# Patient Record
Sex: Female | Born: 1955 | Race: White | Hispanic: No | Smoking: Current every day smoker
Health system: Southern US, Community
[De-identification: ages and names within clinical notes are randomized; demographics above are authoritative.]

## PROBLEM LIST (undated history)

## (undated) DIAGNOSIS — E119 Type 2 diabetes mellitus without complications: Secondary | ICD-10-CM

## (undated) DIAGNOSIS — Z9289 Personal history of other medical treatment: Secondary | ICD-10-CM

## (undated) DIAGNOSIS — E785 Hyperlipidemia, unspecified: Secondary | ICD-10-CM

## (undated) DIAGNOSIS — M48061 Spinal stenosis, lumbar region without neurogenic claudication: Secondary | ICD-10-CM

## (undated) DIAGNOSIS — F419 Anxiety disorder, unspecified: Secondary | ICD-10-CM

## (undated) DIAGNOSIS — D649 Anemia, unspecified: Secondary | ICD-10-CM

## (undated) DIAGNOSIS — I10 Essential (primary) hypertension: Secondary | ICD-10-CM

## (undated) DIAGNOSIS — I493 Ventricular premature depolarization: Secondary | ICD-10-CM

## (undated) HISTORY — DX: Essential (primary) hypertension: I10

## (undated) HISTORY — DX: Type 2 diabetes mellitus without complications: E11.9

## (undated) HISTORY — DX: Hyperlipidemia, unspecified: E78.5

## (undated) HISTORY — DX: Anxiety disorder, unspecified: F41.9

## (undated) HISTORY — DX: Spinal stenosis, lumbar region without neurogenic claudication: M48.061

## (undated) HISTORY — DX: Personal history of other medical treatment: Z92.89

## (undated) HISTORY — DX: Ventricular premature depolarization: I49.3

## (undated) HISTORY — DX: Anemia, unspecified: D64.9

---

## 1983-04-05 DIAGNOSIS — I10 Essential (primary) hypertension: Secondary | ICD-10-CM

## 1983-04-05 HISTORY — DX: Essential (primary) hypertension: I10

## 1991-04-05 HISTORY — PX: ANKLE FRACTURE SURGERY: SHX122

## 2006-04-04 DIAGNOSIS — E119 Type 2 diabetes mellitus without complications: Secondary | ICD-10-CM

## 2006-04-04 DIAGNOSIS — E785 Hyperlipidemia, unspecified: Secondary | ICD-10-CM

## 2006-04-04 HISTORY — DX: Hyperlipidemia, unspecified: E78.5

## 2006-04-04 HISTORY — DX: Type 2 diabetes mellitus without complications: E11.9

## 2009-04-04 HISTORY — PX: CLAVICLE SURGERY: SHX598

## 2012-10-02 DIAGNOSIS — Z9289 Personal history of other medical treatment: Secondary | ICD-10-CM

## 2012-10-02 DIAGNOSIS — I493 Ventricular premature depolarization: Secondary | ICD-10-CM

## 2012-10-02 HISTORY — DX: Personal history of other medical treatment: Z92.89

## 2012-10-02 HISTORY — DX: Ventricular premature depolarization: I49.3

## 2016-04-04 HISTORY — PX: HIP FRACTURE SURGERY: SHX118

## 2017-01-26 ENCOUNTER — Other Ambulatory Visit: Payer: Self-pay | Admitting: Orthopedic Surgery

## 2017-01-26 DIAGNOSIS — E2839 Other primary ovarian failure: Secondary | ICD-10-CM

## 2017-02-01 ENCOUNTER — Ambulatory Visit
Admission: RE | Admit: 2017-02-01 | Discharge: 2017-02-01 | Disposition: A | Discharge status: Home / Self Care | Payer: BLUE CROSS/BLUE SHIELD | Source: Ambulatory Visit | Attending: Orthopedic Surgery | Admitting: Orthopedic Surgery

## 2017-02-01 DIAGNOSIS — E2839 Other primary ovarian failure: Secondary | ICD-10-CM

## 2017-02-09 ENCOUNTER — Other Ambulatory Visit: Payer: Self-pay

## 2017-04-12 ENCOUNTER — Telehealth: Payer: Self-pay | Admitting: Medical

## 2017-04-12 NOTE — Telephone Encounter (Signed)
Records received from [facility] put in your folder for review

## 2017-04-26 ENCOUNTER — Ambulatory Visit: Payer: BLUE CROSS/BLUE SHIELD | Admitting: Medical

## 2017-04-26 ENCOUNTER — Encounter: Payer: Self-pay | Admitting: Medical

## 2017-04-26 VITALS — BP 150/90 | HR 88 | Ht 62.5 in | Wt 152.8 lb

## 2017-04-26 DIAGNOSIS — F418 Other specified anxiety disorders: Secondary | ICD-10-CM | POA: Diagnosis not present

## 2017-04-26 DIAGNOSIS — G4700 Insomnia, unspecified: Secondary | ICD-10-CM

## 2017-04-26 DIAGNOSIS — M545 Low back pain, unspecified: Secondary | ICD-10-CM

## 2017-04-26 DIAGNOSIS — G8929 Other chronic pain: Secondary | ICD-10-CM

## 2017-04-26 DIAGNOSIS — E118 Type 2 diabetes mellitus with unspecified complications: Secondary | ICD-10-CM

## 2017-04-26 DIAGNOSIS — I10 Essential (primary) hypertension: Secondary | ICD-10-CM | POA: Insufficient documentation

## 2017-04-26 DIAGNOSIS — F17200 Nicotine dependence, unspecified, uncomplicated: Secondary | ICD-10-CM | POA: Diagnosis not present

## 2017-04-26 DIAGNOSIS — E785 Hyperlipidemia, unspecified: Secondary | ICD-10-CM | POA: Diagnosis not present

## 2017-04-26 MED ORDER — ZOLPIDEM TARTRATE 5 MG PO TABS: 5.0000 mg | ORAL_TABLET | Freq: Every day | ORAL | 2 refills | Status: DC

## 2017-04-26 MED ORDER — HYDROCODONE-ACETAMINOPHEN 5-325 MG PO TABS: ORAL_TABLET | ORAL | 0 refills | Status: DC

## 2017-04-26 MED ORDER — FLUTICASONE PROPIONATE 50 MCG/ACT NA SUSP: NASAL | 5 refills | Status: DC

## 2017-04-26 NOTE — Progress Notes (Signed)
Subjective: Chief Complaint  Patient presents with  . New Patient (Initial Visit)    new pt need refills on meds just move to the area , pt getting her records sent here from old pcp   Here as a new patient today.   From [place], moved to [place] in 02/2017 to be with her daughter and her family.  Back in [place] was mainly seeing PCP.   Here daughter recently established care here with Harland Dingwall, NP  She has a history of HTN for many years, became diabetic and high cholesterol since 2008.   Last labs for diabetes 01/2017.  HgbA1C was 5.2% on that visit.   At that point was on Glyburide and Metformin.  At that time Trulicity was added and glyburide was taken off.   No hx/o kidney disease or liver disease.  Blood work usually looks good except WBCs run a little elevated.   TRIG are always a little high.    Has hx/o chronic back pain.  Was seeing PT for a while but that didn't help.   Was referred to pain specialist, was suppose to have injection in back but the day she was suppose to see the pain clinic she fell and broke her hip.   Takes 2 tablets of the 4mg  Tizanidine once daily at night.  Takes Hydrocodone 1 tablet BID for pain.   Been on this since 01/2017.  Was on tramadol prior.  In the past has seen pain clinic.  Gets relief with injection in back for periods of time.  She had broken her hip in October, had hip surgery, was seeing Dr. Marcelino Scot, ortho (right hip).   She reports hx/o depression and anxiety.  Takes Lexapro and Abilify, been on this for years.  Insomnia - takes Ambien 5mg  nightly.   Currently she is out of her Ambien, pain medication and Flonase.  Exercise is limited, does some walking.   Eats healthy, checks glucose some, not daily. Usually seeing good numbers.  Had stress test of heart 2014 in [place].   Has moved from [place] to [place], then to [place], then here to [place] with her family (daughter and her family).    Past Medical History:  Diagnosis Date  .  Diabetes mellitus without complication ([place]) 8242  . Hyperlipidemia 2008  . Hypertension 1985   Current Outpatient Medications on File Prior to Visit  Medication Sig Dispense Refill  . ARIPiprazole (ABILIFY) 5 MG tablet Take 5 mg by mouth daily.  3  . aspirin EC 81 MG tablet Take 81 mg by mouth daily.    Marland Kitchen atenolol (TENORMIN) 100 MG tablet Take by mouth.    Marland Kitchen atorvastatin (LIPITOR) 40 MG tablet Take 40 mg by mouth daily.  0  . Cholecalciferol (VITAMIN D PO) Take by mouth.    . escitalopram (LEXAPRO) 20 MG tablet Take 20 mg by mouth daily.  2  . losartan-hydrochlorothiazide (HYZAAR) 100-25 MG tablet Take 1 tablet by mouth daily.  2  . metFORMIN (GLUCOPHAGE) 1000 MG tablet Take 1,000 mg by mouth 2 (two) times daily.  0  . tiZANidine (ZANAFLEX) 4 MG tablet TAKE ONE TABLET BY MOUTH TWICE DAILY AS NEEDED FOR SPASMS  0  . TRULICITY 3.53 IR/4.4RX SOPN 0.75 MG ONCE A WEEK SUBCUTANEOUSLY 90 DAYS  1   No current facility-administered medications on file prior to visit.    ROS as in subjective    Objective: BP (!) 150/90   Pulse 88   Ht 5' 2.5" (  1.588 m)   Wt 152 lb 12.8 oz (69.3 kg)   SpO2 97%   BMI 27.50 kg/m   General appearance: alert, no distress, WD/WN,white female Oral cavity: MMM, no lesions Neck: supple, no lymphadenopathy, no thyromegaly, no masses, no bruits Heart: RRR, normal S1, S2, 2/6 holosystolic murmur heard in bilat upper sternal borders Lungs: CTA bilaterally, no wheezes, rhonchi, or rales Back: nontender, mild pain with ROM which is mildly lmilted Ext: no edema Pulses: 1+ symmetric, upper and lower extremities, normal cap refill     Assessment: Encounter Diagnoses  Name Primary?  . Controlled type 2 diabetes mellitus with complication, without long-term current use of insulin ([place]) Yes  . Essential hypertension, benign   . Hyperlipidemia, unspecified hyperlipidemia type   . Smoker   . Insomnia, unspecified type   . Chronic bilateral low back pain without  sciatica   . Depression with anxiety     Plan: We reviewed her health history, reviewed her medication list.  I had received records on her a few weeks ago but they are in the scan transition right now so they are unable to be reviewed today  From her report today it sounds like she has been pretty consistent with her care with primary care, has been fairly well controlled on her current regimen which seems long-term.  No red flag issues today.  We will try to obtain a copy of her 2014 heart stress test.  There is a heart murmur today and she is aware of this.  We discussed chronic pain, treatment options, we discussed her prior evaluation and treatments.  I advised that we do not typically use chronic narcotics at this office.  I will have a chance to review her records before making a final determination on this.  I gave her a month supply currently.  Likewise I refilled her Ambien for a month  I will plan to see her back in 1 month   Lashanta was seen today for new patient (initial visit).  Diagnoses and all orders for this visit:  Controlled type 2 diabetes mellitus with complication, without long-term current use of insulin (HCC)  Essential hypertension, benign  Hyperlipidemia, unspecified hyperlipidemia type  Smoker  Insomnia, unspecified type  Chronic bilateral low back pain without sciatica  Depression with anxiety  Other orders -     fluticasone (FLONASE) 50 MCG/ACT nasal spray; SPRAY 1 SPRAY INTO EACH NOSTRIL EVERY DAY -     zolpidem (AMBIEN) 5 MG tablet; Take 1 tablet (5 mg total) by mouth at bedtime. -     HYDROcodone-acetaminophen (NORCO/VICODIN) 5-325 MG tablet; TAKE 1 TO 2 TABLETS EVERY 12 HOURS AS NEEDED FOR SEVERE PAIN

## 2017-04-27 ENCOUNTER — Encounter: Payer: Self-pay | Admitting: Medical

## 2017-04-27 ENCOUNTER — Other Ambulatory Visit: Payer: Self-pay | Admitting: Orthopedic Surgery

## 2017-04-27 DIAGNOSIS — M25551 Pain in right hip: Secondary | ICD-10-CM

## 2017-04-27 DIAGNOSIS — S72144K Nondisplaced intertrochanteric fracture of right femur, subsequent encounter for closed fracture with nonunion: Secondary | ICD-10-CM

## 2017-04-30 ENCOUNTER — Telehealth: Payer: Self-pay | Admitting: Medical

## 2017-04-30 NOTE — Telephone Encounter (Signed)
P.A. FLUTICASONE PROPIONATE

## 2017-05-01 ENCOUNTER — Ambulatory Visit
Admission: RE | Admit: 2017-05-01 | Discharge: 2017-05-01 | Disposition: A | Discharge status: Home / Self Care | Payer: BLUE CROSS/BLUE SHIELD | Source: Ambulatory Visit | Attending: Orthopedic Surgery | Admitting: Orthopedic Surgery

## 2017-05-01 DIAGNOSIS — M25551 Pain in right hip: Secondary | ICD-10-CM

## 2017-05-01 DIAGNOSIS — S72144K Nondisplaced intertrochanteric fracture of right femur, subsequent encounter for closed fracture with nonunion: Secondary | ICD-10-CM

## 2017-05-02 ENCOUNTER — Telehealth: Payer: Self-pay | Admitting: Medical

## 2017-05-02 NOTE — Telephone Encounter (Signed)
Rcvd office notes from Dr Trish Mage

## 2017-05-21 NOTE — Telephone Encounter (Signed)
Never got response from P.A., so I had to resubmit another P.A. to a different [employer] plan and was able to get it to go thru and was able to get it approved

## 2017-05-24 NOTE — Telephone Encounter (Signed)
Pt informed

## 2017-05-29 ENCOUNTER — Telehealth: Payer: Self-pay | Admitting: Medical

## 2017-05-29 ENCOUNTER — Encounter: Payer: Self-pay | Admitting: Medical

## 2017-05-29 ENCOUNTER — Ambulatory Visit: Payer: BLUE CROSS/BLUE SHIELD | Admitting: Medical

## 2017-05-29 VITALS — BP 128/88 | HR 86 | Wt 153.8 lb

## 2017-05-29 DIAGNOSIS — E785 Hyperlipidemia, unspecified: Secondary | ICD-10-CM | POA: Diagnosis not present

## 2017-05-29 DIAGNOSIS — F17200 Nicotine dependence, unspecified, uncomplicated: Secondary | ICD-10-CM | POA: Diagnosis not present

## 2017-05-29 DIAGNOSIS — E118 Type 2 diabetes mellitus with unspecified complications: Secondary | ICD-10-CM

## 2017-05-29 DIAGNOSIS — G4700 Insomnia, unspecified: Secondary | ICD-10-CM | POA: Diagnosis not present

## 2017-05-29 DIAGNOSIS — G8929 Other chronic pain: Secondary | ICD-10-CM

## 2017-05-29 DIAGNOSIS — F418 Other specified anxiety disorders: Secondary | ICD-10-CM | POA: Diagnosis not present

## 2017-05-29 DIAGNOSIS — I10 Essential (primary) hypertension: Secondary | ICD-10-CM | POA: Diagnosis not present

## 2017-05-29 DIAGNOSIS — M545 Low back pain, unspecified: Secondary | ICD-10-CM

## 2017-05-29 MED ORDER — TRULICITY 0.75 MG/0.5ML ~~LOC~~ SOAJ: SUBCUTANEOUS | 1 refills | Status: DC

## 2017-05-29 MED ORDER — HYDROCODONE-ACETAMINOPHEN 5-325 MG PO TABS: ORAL_TABLET | ORAL | 0 refills | Status: DC

## 2017-05-29 NOTE — Progress Notes (Addendum)
Subjective: Chief Complaint  Patient presents with  . Follow-up    follow up and discuss truilicity   Here for 1 month recheck.  I saw her as a new patient last month.  She notes that she found out last week that she is being laid off from work.  Works for [employer].    Diabetes- not checking glucose, but not checking.  Checking feet for sores and wounds, no concerns.  Hasn't had Trulicity filled with her current insurance as she has had plenty of supply but has 1 week left of Trulicity and insurance runs out on Thursday.  Fasting today.  Exercise - walking, but with all the recent rain has limited this.    Diet - relatively healthy  She has a history of HTN for many years, became diabetic and high cholesterol since 2008.   Last labs for diabetes 01/2017.  HgbA1C was 5.2% on that visit.   At that point was on Glyburide and Metformin.  At that time Trulicity was added and glyburide was taken off.   No hx/o kidney disease or liver disease.  Blood work usually looks good except WBCs run a little elevated.   TRIG are always a little high.    Has hx/o chronic back pain.  Was seeing PT for a while but that didn't help.   Was referred to pain specialist, was suppose to have injection in back but the day she was suppose to see the pain clinic she fell and broke her hip.   Takes 2 tablets of the 4mg  Tizanidine once daily at night.  Takes Hydrocodone 1 tablet BID for pain.   Been on this since 01/2017.  Was on tramadol prior.  In the past has seen pain clinic.  Gets relief with injection in back for periods of time.  She had broken her hip in October, had hip surgery, was seeing Dr. Marcelino Scot, ortho (right hip).   She reports hx/o depression and anxiety.  Takes Lexapro and Abilify, been on this for years.  Insomnia - takes Ambien 5mg  nightly.    Past Medical History:  Diagnosis Date  . Diabetes mellitus without complication ([place]) 9163  . Hyperlipidemia 2008  . Hypertension 1985  . 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  Dr. Trish Mage   Current Outpatient Medications on File Prior to Visit  Medication Sig Dispense Refill  . ARIPiprazole (ABILIFY) 5 MG tablet Take 5 mg by mouth daily.  3  . aspirin EC 81 MG tablet Take 81 mg by mouth daily.    Marland Kitchen atenolol (TENORMIN) 100 MG tablet Take by mouth.    Marland Kitchen atorvastatin (LIPITOR) 40 MG tablet Take 40 mg by mouth daily.  0  . Cholecalciferol (VITAMIN D PO) Take by mouth.    . escitalopram (LEXAPRO) 20 MG tablet Take 20 mg by mouth daily.  2  . fluticasone (FLONASE) 50 MCG/ACT nasal spray SPRAY 1 SPRAY INTO EACH NOSTRIL EVERY DAY 16 g 5  . losartan-hydrochlorothiazide (HYZAAR) 100-25 MG tablet Take 1 tablet by mouth daily.  2  . metFORMIN (GLUCOPHAGE) 1000 MG tablet Take 1,000 mg by mouth 2 (two) times daily.  0  . tiZANidine (ZANAFLEX) 4 MG tablet TAKE ONE TABLET BY MOUTH TWICE DAILY AS NEEDED FOR SPASMS  0  . zolpidem (AMBIEN) 5 MG tablet Take 1 tablet (5 mg total) by mouth at bedtime. 30 tablet 2   No current facility-administered medications on file prior to  visit.    ROS as in subjective   Objective: BP 128/88   Pulse 86   Wt 153 lb 12.8 oz (69.8 kg)   SpO2 98%   BMI 27.68 kg/m   Wt Readings from Last 3 Encounters:  05/29/17 153 lb 12.8 oz (69.8 kg)  04/26/17 152 lb 12.8 oz (69.3 kg)   General appearance: alert, no distress, WD/WN, white female Oral cavity: MMM, no lesions Neck: supple, no lymphadenopathy, no thyromegaly, no masses Heart: RRR, normal S1, S2, no murmurs Lungs: CTA bilaterally, no wheezes, rhonchi, or rales Abdomen: +bs, soft, non tender, non distended, no masses, no hepatomegaly, no splenomegaly Back: mild lumbar tenderness in general, ROM somewhat slow, 85-90% of normal ROM.  No deformity. Pulses: 1+ symmetric, upper and lower extremities, normal cap refill Ext: no edema Right hip ROM mildly reduced compared to left which is full,  legs nontender, no deformity other than right hip S/P surgery.  No swelling Neuro: CN2-12 intact, nonfocal exam, normal strength, sensation and DTRs Psych: pleasant, good eye contact, answers questions appropriately  Diabetic Foot Exam - Simple   Simple Foot Form Diabetic Foot exam was performed with the following findings:  Yes 05/29/2017  8:40 AM  Visual Inspection No deformities, no ulcerations, no other skin breakdown bilaterally:  Yes Sensation Testing Intact to touch and monofilament testing bilaterally:  Yes Pulse Check Posterior Tibialis and Dorsalis pulse intact bilaterally:  Yes Comments      Assessment: Encounter Diagnoses  Name Primary?  . Controlled type 2 diabetes mellitus with complication, without long-term current use of insulin ([place]) Yes  . Essential hypertension, benign   . Hyperlipidemia, unspecified hyperlipidemia type   . Insomnia, unspecified type   . Smoker   . Depression with anxiety   . Chronic bilateral low back pain without sciatica     Plan: Controlled type 2 diabetes mellitus with complication, without long-term current use of insulin ([place]) Diabetes- recommend she check her sugars at least some days per week, continue daily foot checks, yearly eye doctor visit.  Continue Metformin, and we will push to get the Trulicity pen approved through insurance since she is having some issues with this.  Counseled on diet and exercise, routine follow-up.  Chronic bilateral low back pain without sciatica Chronic back pain, last MRI in chart reviewed from 7/18 lumbar spine.  I reviewed prior records.  Her visit in April 11, 2017 showed a negative drug screen, and at that time tramadol 50 mg was prescribed for as the visit prior to that she was given hydrocodone.  She does take tizanidine muscle relaxer 2 mg, 1-2 tablets up to twice daily.  I counseled on the need to try to limit both pain medication and muscle relaxer including try and 1/2 tablet instead of a whole  tablet and no more than twice daily.  She is still a new patient to [place] making this the second visit.  We also saw her today for her other chronic issues so we will need to spend more time discussing pain control next visit  Essential hypertension, benign Continue current medication.  I reviewed a July 2014 cardiology note where she had evaluation for asymptomatic PVCs, controlled by beta-blocker.  It was recommended at that time that she have an exercise stress test and echocardiogram.  Insomnia Continue current medication  Smoker Advised smoking cessation  Hyperlipidemia Labs today, continue current medication   Lollie was seen today for follow-up.  Diagnoses and all orders for this visit:  Controlled  type 2 diabetes mellitus with complication, without long-term current use of insulin (HCC) -     Comprehensive metabolic panel -     CBC with Differential/Platelet -     Hemoglobin A1c -     HM DIABETES FOOT EXAM -     HM DIABETES EYE EXAM -     Microalbumin / creatinine urine ratio  Essential hypertension, benign -     Comprehensive metabolic panel -     CBC with Differential/Platelet  Hyperlipidemia, unspecified hyperlipidemia type -     Comprehensive metabolic panel -     Lipid panel  Insomnia, unspecified type  Smoker  Depression with anxiety  Chronic bilateral low back pain without sciatica -     CBC with Differential/Platelet  Other orders -     TRULICITY 0.81 KG/8.1EH SOPN; 0.75 MG ONCE A WEEK SUBCUTANEOUSLY 90 DAYS -     HYDROcodone-acetaminophen (NORCO/VICODIN) 5-325 MG tablet; TAKE 1 TO 2 TABLETS EVERY 12 HOURS AS NEEDED FOR SEVERE PAIN

## 2017-05-29 NOTE — Assessment & Plan Note (Signed)
Continue current medication.

## 2017-05-29 NOTE — Patient Instructions (Addendum)
Recommendations:  Call your insurer or review website for Drug Formulary  Trulicity is in a class of medication called GLP1.  The other options in this class are Ozempic and B-Cise /bydureon.  Another class of medication to consider is the SGL2 class which includes Lovie Macadamia and Stilegtro  If we need to use long acting once daily insulin ,the options are Basaglar, Lantus, Tyler Aas, and Toujeo  Vaccines:  I recommend you have a Td (tetanus, diptheria) vaccine.  This vaccine is usually updated every 10 years.  I recommend you have a shingles vaccine to help prevent shingles or herpes zoster outbreak.   Please call your insurer to inquire about coverage for the Shingrix vaccine given in 2 doses.   Some insurers cover this vaccine after age 48, some cover this after age 63.  If your insurer covers this, then call to schedule appointment to have this vaccine here.  I refilled your Norco pain medication today.  Please use them sparingly.  I did see back in October that your prior doctor was planning to wean down to 1 tablet daily.   I recommend you try using 1/2 - 1 tablet as needed for worse pain instead of 1 tablet twice daily every day.   Make stretching and exercise a priority.  I still need to review your records since I just now have them.

## 2017-05-29 NOTE — Assessment & Plan Note (Signed)
-  Advised smoking cessation

## 2017-05-29 NOTE — Assessment & Plan Note (Signed)
Labs today, continue current medication 

## 2017-05-29 NOTE — Assessment & Plan Note (Signed)
Chronic back pain, last MRI in chart reviewed from 7/18 lumbar spine.  I reviewed prior records.  Her visit in April 11, 2017 showed a negative drug screen, and at that time tramadol 50 mg was prescribed for as the visit prior to that she was given hydrocodone.  She does take tizanidine muscle relaxer 2 mg, 1-2 tablets up to twice daily.  I counseled on the need to try to limit both pain medication and muscle relaxer including try and 1/2 tablet instead of a whole tablet and no more than twice daily.  She is still a new patient to [place] making this the second visit.  We also saw her today for her other chronic issues so we will need to spend more time discussing pain control next visit

## 2017-05-29 NOTE — Assessment & Plan Note (Signed)
Diabetes- recommend she check her sugars at least some days per week, continue daily foot checks, yearly eye doctor visit.  Continue Metformin, and we will push to get the Trulicity pen approved through insurance since she is having some issues with this.  Counseled on diet and exercise, routine follow-up.

## 2017-05-29 NOTE — Assessment & Plan Note (Addendum)
Continue current medication.  I reviewed a July 2014 cardiology note where she had evaluation for asymptomatic PVCs, controlled by beta-blocker.  It was recommended at that time that she have an exercise stress test and echocardiogram.

## 2017-05-30 LAB — COMPREHENSIVE METABOLIC PANEL
ALBUMIN: 4.5 g/dL (ref 3.6–4.8)
ALK PHOS: 97 IU/L (ref 39–117)
ALT: 17 IU/L (ref 0–32)
AST: 17 IU/L (ref 0–40)
Albumin/Globulin Ratio: 1.6 (ref 1.2–2.2)
BUN / CREAT RATIO: 17 (ref 12–28)
BUN: 15 mg/dL (ref 8–27)
Bilirubin Total: 0.3 mg/dL (ref 0.0–1.2)
CO2: 25 mmol/L (ref 20–29)
Calcium: 10.6 mg/dL — ABNORMAL HIGH (ref 8.7–10.3)
Chloride: 97 mmol/L (ref 96–106)
Creatinine, Ser: 0.89 mg/dL (ref 0.57–1.00)
GFR calc Af Amer: 81 mL/min/{1.73_m2} (ref >59)
GFR calc non Af Amer: 71 mL/min/{1.73_m2} (ref >59)
GLOBULIN, TOTAL: 2.8 g/dL (ref 1.5–4.5)
GLUCOSE: 112 mg/dL — AB (ref 65–99)
Potassium: 4.9 mmol/L (ref 3.5–5.2)
Sodium: 136 mmol/L (ref 134–144)
Total Protein: 7.3 g/dL (ref 6.0–8.5)

## 2017-05-30 LAB — CBC WITH DIFFERENTIAL/PLATELET
Basophils Absolute: 0 10*3/uL (ref 0.0–0.2)
Basos: 0 %
EOS (ABSOLUTE): 0.3 10*3/uL (ref 0.0–0.4)
Eos: 2 %
Hematocrit: 33.2 % — ABNORMAL LOW (ref 34.0–46.6)
Hemoglobin: 10.7 g/dL — ABNORMAL LOW (ref 11.1–15.9)
Immature Grans (Abs): 0 10*3/uL (ref 0.0–0.1)
Immature Granulocytes: 0 %
LYMPHS ABS: 3.3 10*3/uL — AB (ref 0.7–3.1)
Lymphs: 26 %
MCH: 26.3 pg — ABNORMAL LOW (ref 26.6–33.0)
MCHC: 32.2 g/dL (ref 31.5–35.7)
MCV: 82 fL (ref 79–97)
MONOS ABS: 0.8 10*3/uL (ref 0.1–0.9)
Monocytes: 7 %
Neutrophils Absolute: 8.2 10*3/uL — ABNORMAL HIGH (ref 1.4–7.0)
Neutrophils: 65 %
PLATELETS: 586 10*3/uL — AB (ref 150–379)
RBC: 4.07 x10E6/uL (ref 3.77–5.28)
RDW: 15.7 % — AB (ref 12.3–15.4)
WBC: 12.7 10*3/uL — AB (ref 3.4–10.8)

## 2017-05-30 LAB — LIPID PANEL
CHOL/HDL RATIO: 2.1 ratio (ref 0.0–4.4)
CHOLESTEROL TOTAL: 140 mg/dL (ref 100–199)
HDL: 67 mg/dL (ref >39)
LDL CALC: 42 mg/dL (ref 0–99)
Triglycerides: 157 mg/dL — ABNORMAL HIGH (ref 0–149)
VLDL CHOLESTEROL CAL: 31 mg/dL (ref 5–40)

## 2017-05-30 LAB — HEMOGLOBIN A1C
ESTIMATED AVERAGE GLUCOSE: 134 mg/dL
HEMOGLOBIN A1C: 6.3 % — AB (ref 4.8–5.6)

## 2017-05-30 LAB — MICROALBUMIN / CREATININE URINE RATIO
Creatinine, Urine: 79 mg/dL
Microalb/Creat Ratio: <3.8 mg/g creat (ref 0.0–30.0)
Microalbumin, Urine: <3 ug/mL

## 2017-05-31 ENCOUNTER — Encounter: Payer: Self-pay | Admitting: Medical

## 2017-05-31 NOTE — Addendum Note (Signed)
Addended by: Carlena Hurl on: 05/31/2017 08:46 PM   Modules accepted: Level of Service

## 2017-06-03 NOTE — Telephone Encounter (Signed)
P.A. TRULICITY done and approved, left message for pt, activated discount card and called pharmacy went thru for $25

## 2017-06-08 ENCOUNTER — Encounter: Payer: Self-pay | Admitting: Medical

## 2017-06-12 ENCOUNTER — Other Ambulatory Visit: Payer: Self-pay | Admitting: Medical

## 2017-06-12 ENCOUNTER — Encounter: Payer: Self-pay | Admitting: Medical

## 2017-06-26 ENCOUNTER — Ambulatory Visit: Payer: BLUE CROSS/BLUE SHIELD | Admitting: Medical

## 2017-06-26 ENCOUNTER — Encounter: Payer: Self-pay | Admitting: Medical

## 2017-06-26 ENCOUNTER — Encounter: Payer: Self-pay | Admitting: Internal Medicine

## 2017-06-26 VITALS — BP 128/82 | HR 84 | Ht 63.0 in | Wt 152.0 lb

## 2017-06-26 DIAGNOSIS — G8929 Other chronic pain: Secondary | ICD-10-CM

## 2017-06-26 DIAGNOSIS — I10 Essential (primary) hypertension: Secondary | ICD-10-CM | POA: Diagnosis not present

## 2017-06-26 DIAGNOSIS — D649 Anemia, unspecified: Secondary | ICD-10-CM | POA: Diagnosis not present

## 2017-06-26 DIAGNOSIS — Z1211 Encounter for screening for malignant neoplasm of colon: Secondary | ICD-10-CM | POA: Diagnosis not present

## 2017-06-26 DIAGNOSIS — E118 Type 2 diabetes mellitus with unspecified complications: Secondary | ICD-10-CM

## 2017-06-26 DIAGNOSIS — G4700 Insomnia, unspecified: Secondary | ICD-10-CM | POA: Diagnosis not present

## 2017-06-26 DIAGNOSIS — E785 Hyperlipidemia, unspecified: Secondary | ICD-10-CM | POA: Diagnosis not present

## 2017-06-26 DIAGNOSIS — D72829 Elevated white blood cell count, unspecified: Secondary | ICD-10-CM | POA: Insufficient documentation

## 2017-06-26 DIAGNOSIS — M545 Low back pain: Secondary | ICD-10-CM | POA: Diagnosis not present

## 2017-06-26 DIAGNOSIS — F17200 Nicotine dependence, unspecified, uncomplicated: Secondary | ICD-10-CM | POA: Diagnosis not present

## 2017-06-26 DIAGNOSIS — F418 Other specified anxiety disorders: Secondary | ICD-10-CM

## 2017-06-26 DIAGNOSIS — Z1239 Encounter for other screening for malignant neoplasm of breast: Secondary | ICD-10-CM | POA: Insufficient documentation

## 2017-06-26 MED ORDER — TIZANIDINE HCL 4 MG PO TABS: ORAL_TABLET | ORAL | 2 refills | Status: DC

## 2017-06-26 MED ORDER — ZOLPIDEM TARTRATE 5 MG PO TABS: 5.0000 mg | ORAL_TABLET | Freq: Every day | ORAL | 2 refills | Status: DC

## 2017-06-26 MED ORDER — TRULICITY 0.75 MG/0.5ML ~~LOC~~ SOAJ: SUBCUTANEOUS | 2 refills | Status: DC

## 2017-06-26 MED ORDER — ARIPIPRAZOLE 5 MG PO TABS: 5.0000 mg | ORAL_TABLET | Freq: Every day | ORAL | 1 refills | Status: DC

## 2017-06-26 MED ORDER — ATORVASTATIN CALCIUM 40 MG PO TABS: 40.0000 mg | ORAL_TABLET | Freq: Every day | ORAL | 3 refills | Status: AC

## 2017-06-26 MED ORDER — ESCITALOPRAM OXALATE 20 MG PO TABS: 20.0000 mg | ORAL_TABLET | Freq: Every day | ORAL | 1 refills | Status: DC

## 2017-06-26 MED ORDER — METFORMIN HCL 1000 MG PO TABS: 1000.0000 mg | ORAL_TABLET | Freq: Two times a day (BID) | ORAL | 3 refills | Status: DC

## 2017-06-26 MED ORDER — LOSARTAN POTASSIUM-HCTZ 100-25 MG PO TABS: 1.0000 | ORAL_TABLET | Freq: Every day | ORAL | 3 refills | Status: DC

## 2017-06-26 MED ORDER — HYDROCODONE-ACETAMINOPHEN 5-325 MG PO TABS: ORAL_TABLET | ORAL | 0 refills | Status: DC

## 2017-06-26 NOTE — Progress Notes (Signed)
Subjective: Chief Complaint  Patient presents with  . Follow-up    blood work follow, and pain follow up    Here for one-month follow-up.   I saw her a month ago for diabetes and chronic disease follow-up, labs that visit.  Her labs showed anemia.  Denies bruising, no blood in stool or urine.  No other bleeding.  Last colonoscopy was 10 years in [place], [place].  Not sure of last Hgb.  Denies anemia on last labs in past year.  Her labs last visit also showed elevated white count, mildly elevated calcium.  Last visit her hemoglobin A1c was 6.3%, we continue Metformin and Trulicity.  She has a history of HTN for many years, became diabetic and high cholesterol since 2008.   Last labs for diabetes 01/2017.  HgbA1C was 5.2% on that visit.   At that point was on Glyburide and Metformin.  At that time Trulicity was added and glyburide was taken off.   No hx/o kidney disease or liver disease.  Blood work usually looks good except WBCs run a little elevated.   TRIG are always a little high.    Has hx/o chronic back pain.  Was seeing PT for a while but that didn't help.   Was referred to pain specialist, was suppose to have injection in back but the day she was suppose to see the pain clinic she fell and broke her hip.  Uses exercises from PT for home exercise.   Has chronic back and leg pain.  Walks the neighborhood regularly.   Takes 2 tablets of the 4mg  Tizanidine once daily at night.  Takes Hydrocodone 1 tablet BID for pain.   Been on this since 01/2017.  Was on tramadol prior.  In the past has seen pain clinic.  Gets relief with injection in back for periods of time.  She had broken her hip in October, had hip surgery, was seeing Dr. Marcelino Scot, ortho (right hip).  Is interested in another injection in her back for pain.   Last MRI 11/2016.   She reports hx/o depression and anxiety.  Takes Lexapro and Abilify, been on this for years.  Insomnia - takes Ambien 5mg  nightly.   Of note, she had stress test and  echo in 2014, reviewed records.    Past Medical History:  Diagnosis Date  . Diabetes mellitus without complication ([place]) 2595  . History of stress test 10/2012   stress test and echo  . Hyperlipidemia 2008  . Hypertension 1985  . PVC (premature ventricular contraction) 10/2012   cardiology eval for Asymptomatic PVCs controlled on beta-blocker.  Dr. Trish Mage   Current Outpatient Medications on File Prior to Visit  Medication Sig Dispense Refill  . aspirin EC 81 MG tablet Take 81 mg by mouth daily.    Marland Kitchen atenolol (TENORMIN) 100 MG tablet TAKE 1 TABLET BY MOUTH EVERY DAY 90 tablet 0  . Cholecalciferol (VITAMIN D PO) Take by mouth.    . fluticasone (FLONASE) 50 MCG/ACT nasal spray SPRAY 1 SPRAY INTO EACH NOSTRIL EVERY DAY 16 g 5  . MAGNESIUM PO Take by mouth.     No current facility-administered medications on file prior to visit.     ROS as in subjective   Objective: BP 128/82 (BP Location: Right Arm, Patient Position: Sitting, Cuff Size: Normal)   Pulse 84   Ht 5\' 3"  (1.6 m)   Wt 152 lb (68.9 kg)   SpO2 98%   BMI 26.93 kg/m   Wt  Readings from Last 3 Encounters:  06/26/17 152 lb (68.9 kg)  05/29/17 153 lb 12.8 oz (69.8 kg)  04/26/17 152 lb 12.8 oz (69.3 kg)   General appearance: alert, no distress, WD/WN, white female Psych: pleasant, good eye contact, answers questions appropriately    Assessment: Encounter Diagnoses  Name Primary?  . Chronic bilateral low back pain without sciatica Yes  . Diabetes mellitus with complication ([place])   . Essential hypertension, benign   . Depression with anxiety   . Hyperlipidemia, unspecified hyperlipidemia type   . Smoker   . Insomnia, unspecified type   . Anemia, unspecified type   . Leukocytosis, unspecified type   . Hypercalcemia   . Screen for colon cancer     Plan: Anemia - plan to update coloscopy, referral made.  Iron/ferrin labs today.  leukocytosis - has seen hematology twice prior for this.   Last consult  for this was in [place] in 2015.  Saw hematology for this prior in [place], [place].   We will monitor for now.  Hypercalcemia - she notes taking 1200mg  of calcium and Vit D once and another supplement at night.  Advised to stop the night time dose and use the morning dose but split up to BID.  Plan to recheck this in 36mo.    Controlled type 2 diabetes mellitus with complication, without long-term current use of insulin (HCC) C/t truliticy and metformin.  Chronic bilateral low back pain without sciatica Chronic back pain, last MRI in chart reviewed from 7/18 lumbar spine.  I reviewed prior records.  Her visit in April 11, 2017 showed a negative drug screen.  We will continue medications currently, but she is a new patient to [place].   Referral to spine specialist for possible EDSI or other treatment recommendations.    Essential hypertension, benign Continue current medication.  I reviewed 2014 stress test and echo.  Insomnia Continue current medication  Smoker Advised smoking cessation  Hyperlipidemia Labs today, continue current medication  Tomorrow was seen today for follow-up.  Diagnoses and all orders for this visit:  Chronic bilateral low back pain without sciatica -     Ambulatory referral to Spine Surgery  Diabetes mellitus with complication ([place]) -     Hemoglobin A1c; Future  Essential hypertension, benign  Depression with anxiety  Hyperlipidemia, unspecified hyperlipidemia type  Smoker  Insomnia, unspecified type  Anemia, unspecified type -     Ambulatory referral to Gastroenterology -     Iron and TIBC; Future -     Ferritin; Future -     Comprehensive metabolic panel; Future -     Iron and TIBC -     Ferritin  Leukocytosis, unspecified type  Hypercalcemia -     Comprehensive metabolic panel; Future  Screen for colon cancer -     Ambulatory referral to Gastroenterology  Other orders -     metFORMIN (GLUCOPHAGE) 1000 MG tablet; Take 1  tablet (1,000 mg total) by mouth 2 (two) times daily with a meal. -     TRULICITY 4.40 HK/7.4QV SOPN; 0.75 MG ONCE A WEEK SUBCUTANEOUSLY 90 DAYS -     atorvastatin (LIPITOR) 40 MG tablet; Take 1 tablet (40 mg total) by mouth daily. -     ARIPiprazole (ABILIFY) 5 MG tablet; Take 1 tablet (5 mg total) by mouth daily. -     escitalopram (LEXAPRO) 20 MG tablet; Take 1 tablet (20 mg total) by mouth daily. -     losartan-hydrochlorothiazide (HYZAAR) 100-25 MG  tablet; Take 1 tablet by mouth daily. -     tiZANidine (ZANAFLEX) 4 MG tablet; TAKE 1/2 TABLET BY MOUTH TWICE DAILY AS NEEDED FOR SPASMS -     HYDROcodone-acetaminophen (NORCO/VICODIN) 5-325 MG tablet; TAKE 1 TO 2 TABLETS EVERY 12 HOURS AS NEEDED FOR SEVERE PAIN -     zolpidem (AMBIEN) 5 MG tablet; Take 1 tablet (5 mg total) by mouth at bedtime.

## 2017-06-27 ENCOUNTER — Other Ambulatory Visit: Payer: Self-pay | Admitting: Medical

## 2017-06-27 ENCOUNTER — Telehealth: Payer: Self-pay

## 2017-06-27 LAB — IRON AND TIBC
IRON SATURATION: 5 % — AB (ref 15–55)
Iron: 22 ug/dL — ABNORMAL LOW (ref 27–159)
TIBC: 484 ug/dL — AB (ref 250–450)
UIBC: 462 ug/dL — ABNORMAL HIGH (ref 131–425)

## 2017-06-27 LAB — FERRITIN: FERRITIN: 6 ng/mL — AB (ref 15–150)

## 2017-06-27 MED ORDER — FERROUS GLUCONATE 324 (38 FE) MG PO TABS: 324.0000 mg | ORAL_TABLET | Freq: Every day | ORAL | 3 refills | Status: DC

## 2017-06-27 NOTE — Telephone Encounter (Signed)
-----   Message from Carlena Hurl, PA-C sent at 06/27/2017  7:34 AM EDT ----- Her iron was quite low.  Please have her begin ferrous gluconate/iron twice daily with food immediately, and get her in with the gastro doctor ASAP.  You may have already handled the referral yesterday  Otherwise, plan 46-month follow-up here

## 2017-06-27 NOTE — Telephone Encounter (Signed)
Called patient and advised of note.   Patient scheduled with GI, but the soonest was in May.   Also, patient states the RX states take 1 tablet at breakfast, but the note states to take the RX twice daily.  Please advise. Thank you!

## 2017-06-28 ENCOUNTER — Telehealth: Payer: Self-pay

## 2017-06-28 NOTE — Telephone Encounter (Signed)
Called patient and advised of note. Patient understand and had no questions or concerns.

## 2017-06-28 NOTE — Telephone Encounter (Signed)
-----   Message from Carlena Hurl, PA-C sent at 06/27/2017  8:21 PM EDT ----- Sorry it should be BID not once daily.   May will be fine.

## 2017-07-09 ENCOUNTER — Telehealth: Payer: Self-pay | Admitting: Medical

## 2017-07-09 NOTE — Telephone Encounter (Signed)
P.A. HYDROCODONE ACETAMINOPHEN 

## 2017-07-18 NOTE — Telephone Encounter (Signed)
P.A. Approved, pt informed  °

## 2017-07-19 ENCOUNTER — Encounter: Payer: Self-pay | Admitting: Medical

## 2017-07-24 ENCOUNTER — Other Ambulatory Visit: Payer: Self-pay | Admitting: Medical

## 2017-07-24 MED ORDER — HYDROCODONE-ACETAMINOPHEN 5-325 MG PO TABS: ORAL_TABLET | ORAL | 0 refills | Status: DC

## 2017-07-24 NOTE — Telephone Encounter (Signed)
Is this okay to refill? 

## 2017-07-28 ENCOUNTER — Other Ambulatory Visit: Payer: Self-pay

## 2017-07-28 MED ORDER — FERROUS GLUCONATE 324 (38 FE) MG PO TABS: 324.0000 mg | ORAL_TABLET | Freq: Every day | ORAL | 3 refills | Status: DC

## 2017-07-28 NOTE — Telephone Encounter (Signed)
Pt called stating she needs a refill on the pending medication.  Please advise patient.

## 2017-08-07 ENCOUNTER — Other Ambulatory Visit (INDEPENDENT_AMBULATORY_CARE_PROVIDER_SITE_OTHER): Payer: BLUE CROSS/BLUE SHIELD

## 2017-08-07 ENCOUNTER — Ambulatory Visit: Payer: BLUE CROSS/BLUE SHIELD | Admitting: Internal Medicine

## 2017-08-07 ENCOUNTER — Encounter: Payer: Self-pay | Admitting: Internal Medicine

## 2017-08-07 VITALS — BP 110/64 | HR 80 | Ht 62.0 in | Wt 153.4 lb

## 2017-08-07 DIAGNOSIS — D509 Iron deficiency anemia, unspecified: Secondary | ICD-10-CM | POA: Diagnosis not present

## 2017-08-07 LAB — CBC WITH DIFFERENTIAL/PLATELET
BASOS PCT: 0.7 % (ref 0.0–3.0)
Basophils Absolute: 0.1 10*3/uL (ref 0.0–0.1)
EOS PCT: 2.6 % (ref 0.0–5.0)
Eosinophils Absolute: 0.2 10*3/uL (ref 0.0–0.7)
HCT: 36.3 % (ref 36.0–46.0)
Hemoglobin: 12 g/dL (ref 12.0–15.0)
LYMPHS ABS: 2.8 10*3/uL (ref 0.7–4.0)
LYMPHS PCT: 31.3 % (ref 12.0–46.0)
MCHC: 33.2 g/dL (ref 30.0–36.0)
MCV: 88.3 fl (ref 78.0–100.0)
MONO ABS: 0.6 10*3/uL (ref 0.1–1.0)
MONOS PCT: 7.4 % (ref 3.0–12.0)
NEUTROS ABS: 5.1 10*3/uL (ref 1.4–7.7)
NEUTROS PCT: 58 % (ref 43.0–77.0)
PLATELETS: 389 10*3/uL (ref 150.0–400.0)
RBC: 4.11 Mil/uL (ref 3.87–5.11)
RDW: 20 % — AB (ref 11.5–15.5)
WBC: 8.8 10*3/uL (ref 4.0–10.5)

## 2017-08-07 LAB — IGA: IgA: 247 mg/dL (ref 68–378)

## 2017-08-07 LAB — FERRITIN: FERRITIN: 12.5 ng/mL (ref 10.0–291.0)

## 2017-08-07 MED ORDER — NA SULFATE-K SULFATE-MG SULF 17.5-3.13-1.6 GM/177ML PO SOLN: 1.0000 | Freq: Once | ORAL | 0 refills | Status: AC

## 2017-08-07 NOTE — Progress Notes (Signed)
HISTORY OF PRESENT ILLNESS:  Kelsey White is a pleasant 62 y.o. female with hypertension, hyperlipidemia, diabetes mellitus, and chronic tobacco use who is new to GI and sent by her primary care provider Kelsey White with a chief complaint of iron deficiency anemia. The patient is accompanied by her daughter Kelsey White. Patient denies a prior history of anemia. She has been feeling fatigued. GI review of systems is entirely negative. She denies melena, hematochezia, abdominal pain, or weight loss. Recent blood work revealed hemoglobin of 10.7 with MCV 82. Ferritin level was 6 with iron saturation 5%. The patient has not had a menstrual period in 11 years. She has not a blood donor. No family history of celiac disease. She has been diabetic for about 15 years. She did undergo colonoscopy 10 years ago in [place]. She reports this as unremarkable. The patient has been on iron sulfate 325 mg twice daily for one month.   REVIEW OF SYSTEMS:  All non-GI ROS negative unless otherwise stated in the history of present illness except for anxiety  Past Medical History:  Diagnosis Date  . Anemia   . Anxiety   . Diabetes mellitus without complication (Kelsey White) 4270  . History of stress test 10/2012   stress test and echo  . Hyperlipidemia 2008  . Hypertension 1985  . PVC (premature ventricular contraction) 10/2012   cardiology eval for Asymptomatic PVCs controlled on beta-blocker.  Dr. Trish White    Past Surgical History:  Procedure Laterality Date  . ANKLE FRACTURE SURGERY Left 1993  . CLAVICLE SURGERY Left 2011  . HIP FRACTURE SURGERY Right 2018    Social History Kelsey White  reports that she has been smoking cigarettes.  She has been smoking about 1.00 pack per day. She has never used smokeless tobacco. She reports that she drinks about 0.6 oz of alcohol per week. She reports that she does not use drugs.  family history includes Diabetes in her father and sister; Hypertension in her  father, mother, and sister; Hypothyroidism in her daughter, sister, and sister; Lung cancer in her father.  No Known Allergies     PHYSICAL EXAMINATION: Vital signs: BP 110/64 (BP Location: Left Arm, Patient Position: Sitting, Cuff Size: Normal)   Pulse 80   Ht 5\' 2"  (1.575 m) Comment: height measured without shoes  Wt 153 lb 6 oz (69.6 kg)   BMI 28.05 kg/m   Constitutional: generally well-appearing, no acute distress Psychiatric: alert and oriented x3, cooperative Eyes: extraocular movements intact, anicteric, conjunctiva pink Mouth: oral pharynx moist, no lesions Neck: supple no lymphadenopathy Cardiovascular: heart regular rate and rhythm, no murmur Lungs: clear to auscultation bilaterally Abdomen: soft, nontender, nondistended, no obvious ascites, no peritoneal signs, normal bowel sounds, no organomegaly Rectal:deferred until colonoscopy Extremities: no clubbing, cyanosis, or lower extremity edema bilaterally Skin: no lesions on visible extremities Neuro: No focal deficits. Normal DTRs. Cranial nerves intact  ASSESSMENT:  #1. Asymptomatic and deficiency anemia. Rule out GI mucosal lesion. Rule out iron absorptive disorder #2. Diabetes mellitus  PLAN:  #1. Screen for celiac disease with tissue transglutaminase antibody IgA and serum IgA level #2. CBC and ferritin today to see how she is responding to iron therapy #3. Schedule colonoscopy to evaluate iron deficiency anemia.The nature of the procedure, as well as the risks, benefits, and alternatives were carefully and thoroughly reviewed with the patient. Ample time for discussion and questions allowed. The patient understood, was satisfied, and agreed to proceed. #4. Schedule upper endoscopy. To evaluate iron deficiency anemia.  Duodenal biopsies to rule out sprue.The nature of the procedure, as well as the risks, benefits, and alternatives were carefully and thoroughly reviewed with the patient. Ample time for discussion and  questions allowed. The patient understood, was satisfied, and agreed to proceed. #5. Hold diabetic medications the day of the procedure to avoid and wanted hypoglycemia #6. Hold iron therapy 1 week prior to the examination to assist with colon cleansing  A copy of this consultation note has been sent to Kelsey White

## 2017-08-07 NOTE — Patient Instructions (Signed)
Your provider has requested that you go to the basement level for lab work before leaving today. Press "B" on the elevator. The lab is located at the first door on the left as you exit the elevator.  You have been scheduled for an endoscopy and colonoscopy. Please follow the written instructions given to you at your visit today. Please pick up your prep supplies at the pharmacy within the next 1-3 days. If you use inhalers (even only as needed), please bring them with you on the day of your procedure. Your physician has requested that you go to www.startemmi.com and enter the access code given to you at your visit today. This web site gives a general overview about your procedure. However, you should still follow specific instructions given to you by our office regarding your preparation for the procedure.  

## 2017-08-08 LAB — TISSUE TRANSGLUTAMINASE, IGA: (TTG) AB, IGA: 1 U/mL

## 2017-08-14 ENCOUNTER — Encounter: Payer: Self-pay | Admitting: Medical

## 2017-08-23 ENCOUNTER — Other Ambulatory Visit: Payer: Self-pay | Admitting: Family Medicine

## 2017-08-23 NOTE — Telephone Encounter (Signed)
Your patient 

## 2017-08-23 NOTE — Telephone Encounter (Signed)
Please check status on referral.   See last e-mail and phone messages.  Kelsey White had called upon last refill request.  She should already have an answer on appt time with pain specialist Let me know before I decide whether to refill this medication

## 2017-08-23 NOTE — Telephone Encounter (Signed)
[facility] to [facility]. Please advise [facility]

## 2017-08-25 ENCOUNTER — Telehealth: Payer: Self-pay

## 2017-08-25 MED ORDER — HYDROCODONE-ACETAMINOPHEN 5-325 MG PO TABS: ORAL_TABLET | ORAL | 0 refills | Status: DC

## 2017-08-25 NOTE — Telephone Encounter (Signed)
[facility] and pt has had a new pt appt on May 9th. She had an injection on May 15th which targeted the right side and she had another injection on 6/19. Pt states she has one pill left for tonight of pain medicine but then will be out. Pt has not asked [facility] to refill med since she has been getting this from you. Pt was advised that [facility] closes at noon on Friday so I would have to ask if you could refill it for the weekend and then patient call [facility] next week about getting it refilled from them since they are now treating her for her back pain. [facility] is faxing over the office notes from May 9th visit and May 15th injection. Please advise pt if you can refill med just enough for the weekend if you want her to start getting it from [facility].

## 2017-08-25 NOTE — Telephone Encounter (Signed)
Patient left voicemail wanting a refill on Norco for pain in her back.  Patient states that she did have an injection on the right side of her back and it did help a little and she is going to have an injection on the left side in June but it is still painful.  She can not take OTC meds they cause upset stomach.   She would [facility] [address]   She can be contacted at [phone].

## 2017-08-25 NOTE — Telephone Encounter (Signed)
See refill request from 5/22 about notes from [facility]

## 2017-08-30 ENCOUNTER — Encounter: Payer: Self-pay | Admitting: Medical

## 2017-08-30 ENCOUNTER — Ambulatory Visit: Payer: BLUE CROSS/BLUE SHIELD | Admitting: Medical

## 2017-08-30 VITALS — BP 108/80 | HR 75 | Temp 98.1°F | Ht 62.0 in | Wt 152.4 lb

## 2017-08-30 DIAGNOSIS — F17200 Nicotine dependence, unspecified, uncomplicated: Secondary | ICD-10-CM | POA: Diagnosis not present

## 2017-08-30 DIAGNOSIS — E118 Type 2 diabetes mellitus with unspecified complications: Secondary | ICD-10-CM

## 2017-08-30 DIAGNOSIS — I10 Essential (primary) hypertension: Secondary | ICD-10-CM

## 2017-08-30 DIAGNOSIS — E785 Hyperlipidemia, unspecified: Secondary | ICD-10-CM

## 2017-08-30 DIAGNOSIS — D649 Anemia, unspecified: Secondary | ICD-10-CM

## 2017-08-30 MED ORDER — FERROUS GLUCONATE 324 (38 FE) MG PO TABS: 324.0000 mg | ORAL_TABLET | Freq: Two times a day (BID) | ORAL | 2 refills | Status: DC

## 2017-08-30 NOTE — Progress Notes (Signed)
Subjective: Chief Complaint  Patient presents with  . Diabetes  . Medication Management    wants to discuss iron prescription     Here for f/u.  Recently saw GI in follow.   Iron was low in 06/2017, but she is taking supplement, recent ferritin shows improvement. Reviewed GI notes from 08/07/17 and she will be having EGD and colonoscopy soon, 09/28/17.    Diabetes: Current treatments: Trulicity 8.11 weekly, Metformin 1000mg  BID Medication compliance: excellent  Patient is checking home blood sugars.   Home blood sugar records: 120-170 Current symptoms include: none. Patient denies foot ulcerations, hyperglycemia, hypoglycemia , nausea, polydipsia and polyuria.  Patient is checking their feet daily. Foot concerns (callous, ulcer, wound, thickened nails, toenail fungus, skin fungus, hammer toe): none Current diet: in general, a "healthy" diet   Current exercise: walking the dogs   HTN - compliant with Atenolol 100mg  daily, Losartan HCT 100/25mg  daily,   Hyperlipidemia - compliant with Lipitor 40mg  daily, aspirin 81mg  daily  Past Medical History:  Diagnosis Date  . Anemia   . Anxiety   . Diabetes mellitus without complication ([place]) 9147  . History of stress test 10/2012   stress test and echo  . Hyperlipidemia 2008  . Hypertension 1985  . PVC (premature ventricular contraction) 10/2012   cardiology eval for Asymptomatic PVCs controlled on beta-blocker.  Dr. Trish Mage   Current Outpatient Medications on File Prior to Visit  Medication Sig Dispense Refill  . ARIPiprazole (ABILIFY) 5 MG tablet Take 1 tablet (5 mg total) by mouth daily. 90 tablet 1  . aspirin EC 81 MG tablet Take 81 mg by mouth daily.    Marland Kitchen atenolol (TENORMIN) 100 MG tablet TAKE 1 TABLET BY MOUTH EVERY DAY 90 tablet 0  . atorvastatin (LIPITOR) 40 MG tablet Take 1 tablet (40 mg total) by mouth daily. 90 tablet 3  . Cholecalciferol (VITAMIN D PO) Take by mouth.    . escitalopram (LEXAPRO) 20 MG tablet Take 1  tablet (20 mg total) by mouth daily. 90 tablet 1  . ferrous gluconate (FERGON) 324 MG tablet Take 1 tablet (324 mg total) by mouth daily with breakfast. 60 tablet 3  . fluticasone (FLONASE) 50 MCG/ACT nasal spray SPRAY 1 SPRAY INTO EACH NOSTRIL EVERY DAY 16 g 5  . HYDROcodone-acetaminophen (NORCO/VICODIN) 5-325 MG tablet TAKE 1 TO 2 TABLETS EVERY 12 HOURS AS NEEDED FOR SEVERE PAIN 60 tablet 0  . losartan-hydrochlorothiazide (HYZAAR) 100-25 MG tablet Take 1 tablet by mouth daily. 90 tablet 3  . MAGNESIUM PO Take by mouth.    . metFORMIN (GLUCOPHAGE) 1000 MG tablet Take 1 tablet (1,000 mg total) by mouth 2 (two) times daily with a meal. 180 tablet 3  . tiZANidine (ZANAFLEX) 4 MG tablet TAKE 1/2 TABLET BY MOUTH TWICE DAILY AS NEEDED FOR SPASMS 30 tablet 2  . TRULICITY 8.29 FA/2.1HY SOPN 0.75 MG ONCE A WEEK SUBCUTANEOUSLY 90 DAYS 1.5 mL 2  . zolpidem (AMBIEN) 5 MG tablet Take 1 tablet (5 mg total) by mouth at bedtime. 30 tablet 2   No current facility-administered medications on file prior to visit.    ROS as in subjective    Objective BP 108/80   Pulse 75   Temp 98.1 F (36.7 C) (Oral)   Ht 5\' 2"  (1.575 m)   Wt 152 lb 6.4 oz (69.1 kg)   SpO2 98%   BMI 27.87 kg/m   General appearance: alert, no distress, WD/WN,  Neck: supple, no lymphadenopathy, no thyromegaly, no  masses Heart: RRR, normal S1, S2, no murmurs Lungs: CTA bilaterally, no wheezes, rhonchi, or rales Abdomen: +bs, soft, non tender, non distended, no masses, no hepatomegaly, no splenomegaly Pulses: 2+ symmetric, upper and lower extremities, normal cap refill Ext: no edema  Diabetic Foot Exam - Simple   Simple Foot Form Diabetic Foot exam was performed with the following findings:  Yes 08/30/2017  9:03 AM  Visual Inspection No deformities, no ulcerations, no other skin breakdown bilaterally:  Yes Sensation Testing Intact to touch and monofilament testing bilaterally:  Yes Pulse Check Posterior Tibialis and Dorsalis  pulse intact bilaterally:  Yes Comments     Adult ECG Report  Indication: diabetes, HTN  Rate: 74 bpm  Rhythm: normal sinus rhythm  QRS Axis: 46 degrees  PR Interval: 115ms  QRS Duration: 65ms  QTc: 476ms  Conduction Disturbances: none  Other Abnormalities: none  Patient's cardiac risk factors are: diabetes mellitus, dyslipidemia, hypertension and smoking/ tobacco exposure.  EKG comparison: none  Narrative Interpretation: normal EKG     Assessment: Encounter Diagnoses  Name Primary?  . Diabetes mellitus with complication ([place]) Yes  . Essential hypertension, benign   . Hyperlipidemia, unspecified hyperlipidemia type   . Smoker   . Anemia, unspecified type      Plan: Diabetes-given the recent steroid injection by neurosurgery her sugars are running high.  Discussed strict diet, regular exercise, continue current medications.  Reviewed last hemoglobin A1c from February.  Continue daily foot checks, routine glucose monitoring.  Hypertension-continue same medication  Hyperlipidemia-continue same medication  Smoking-advise she stop smoking, discussed risk of tobacco use  Anemia-reviewed recent gastroenterology records, recent labs for ferritin and CBC.  Continue iron.  She is due for colonoscopy and endoscopy in June.  Follow-up in 3 months   Kelsey White was seen today for diabetes and medication management.  Diagnoses and all orders for this visit:  Diabetes mellitus with complication ([place])  Essential hypertension, benign  Hyperlipidemia, unspecified hyperlipidemia type  Smoker  Anemia, unspecified type

## 2017-09-01 NOTE — Addendum Note (Signed)
Addended by: Carlena Hurl on: 09/01/2017 08:04 AM   Modules accepted: Orders

## 2017-09-09 ENCOUNTER — Other Ambulatory Visit: Payer: Self-pay | Admitting: Medical

## 2017-09-11 ENCOUNTER — Other Ambulatory Visit: Payer: Self-pay | Admitting: Medical

## 2017-09-12 ENCOUNTER — Ambulatory Visit: Payer: BLUE CROSS/BLUE SHIELD | Admitting: Medical

## 2017-09-12 ENCOUNTER — Encounter: Payer: Self-pay | Admitting: Medical

## 2017-09-12 VITALS — BP 100/68 | HR 94 | Temp 98.4°F | Ht 62.0 in | Wt 152.4 lb

## 2017-09-12 DIAGNOSIS — R059 Cough, unspecified: Secondary | ICD-10-CM

## 2017-09-12 DIAGNOSIS — R05 Cough: Secondary | ICD-10-CM

## 2017-09-12 DIAGNOSIS — J0110 Acute frontal sinusitis, unspecified: Secondary | ICD-10-CM | POA: Diagnosis not present

## 2017-09-12 MED ORDER — AMOXICILLIN 875 MG PO TABS: 875.0000 mg | ORAL_TABLET | Freq: Two times a day (BID) | ORAL | 0 refills | Status: DC

## 2017-09-12 NOTE — Progress Notes (Signed)
Subjective:  Kelsey White is a 62 y.o. female who presents for possible bronchitis.   Has 4 days of cough, side sore from cough, headache over eyes, sinus pressure, low grade fever.  No NV, but has had some loose stool this morning.  Cough is productive.  Using Advil Cold and Sinus and robitussin for symptoms. reports sick contacts.  Past history is significant for smoker x 40 years.  In the past has gotten bronchitis about 4 years ago.  There has been questionable history of COPD.  No other aggravating or relieving factors.  No other c/o.   The following portions of the patient's history were reviewed and updated as appropriate: allergies, current medications, past family history, past medical history, past social history, past surgical history and problem list.  ROS as in subjective  Past Medical History:  Diagnosis Date  . Anemia   . Anxiety   . Diabetes mellitus without complication (Kelsey White) 1610  . History of stress test 10/2012   stress test and echo  . Hyperlipidemia 2008  . Hypertension 1985  . PVC (premature ventricular contraction) 10/2012   cardiology eval for Asymptomatic PVCs controlled on beta-blocker.  Dr. Trish Mage      Objective: BP 100/68   Pulse 94   Temp 98.4 F (36.9 C) (Oral)   Ht 5\' 2"  (1.575 m)   Wt 152 lb 6.4 oz (69.1 kg)   SpO2 96%   BMI 27.87 kg/m   General appearance: Alert, WD/WN, no distress, mildly ill appearing                             Skin: warm, no rash, no diaphoresis                           Head:mild +sinus tenderness                            Eyes: conjunctiva normal, corneas clear, PERRLA                            Ears: pearly TMs, external ear canals normal                          Nose: septum midline, turbinates swollen, with erythema and clear discharge             Mouth/throat: MMM, tongue normal, mild pharyngeal erythema                           Neck: supple, no adenopathy, no thyromegaly, nontender       Heart: RRR, normal S1, S2, no murmurs                         Lungs: clear, no wheezes, no rales                Extremities: no edema, nontender       Assessment: Encounter Diagnoses  Name Primary?  . Acute non-recurrent frontal sinusitis Yes  . Cough      Plan:  Discussed diagnosis and treatment.  Suggested symptomatic OTC remedies for cough and congestion.  Tylenol or Ibuprofen OTC for fever and malaise.  Call/return in 2-3 days if symptoms are worse or  not improving.  Advised that cough may linger even after the infection is improved.   Kelsey White was seen today for sinusitis.  Diagnoses and all orders for this visit:  Acute non-recurrent frontal sinusitis  Cough  Other orders -     amoxicillin (AMOXIL) 875 MG tablet; Take 1 tablet (875 mg total) by mouth 2 (two) times daily.

## 2017-09-13 ENCOUNTER — Encounter: Payer: Self-pay | Admitting: Medical

## 2017-09-14 ENCOUNTER — Telehealth: Payer: Self-pay | Admitting: Medical

## 2017-09-14 ENCOUNTER — Other Ambulatory Visit: Payer: Self-pay | Admitting: Medical

## 2017-09-14 ENCOUNTER — Encounter: Payer: Self-pay | Admitting: Internal Medicine

## 2017-09-14 MED ORDER — PROMETHAZINE-DM 6.25-15 MG/5ML PO SYRP: 5.0000 mL | ORAL_SOLUTION | Freq: Four times a day (QID) | ORAL | 0 refills | Status: DC | PRN

## 2017-09-14 NOTE — Telephone Encounter (Signed)
Called pt and informed cough medicine called in.

## 2017-09-21 ENCOUNTER — Other Ambulatory Visit: Payer: Self-pay | Admitting: Medical

## 2017-09-21 MED ORDER — HYDROCODONE-ACETAMINOPHEN 5-325 MG PO TABS: ORAL_TABLET | ORAL | 0 refills | Status: DC

## 2017-09-21 NOTE — Telephone Encounter (Signed)
Is this ok to refill?  

## 2017-09-22 ENCOUNTER — Other Ambulatory Visit: Payer: Self-pay | Admitting: Medical

## 2017-09-25 ENCOUNTER — Encounter: Payer: Self-pay | Admitting: *Deleted

## 2017-09-28 ENCOUNTER — Other Ambulatory Visit: Payer: Self-pay

## 2017-09-28 ENCOUNTER — Ambulatory Visit (INDEPENDENT_AMBULATORY_CARE_PROVIDER_SITE_OTHER): Payer: BLUE CROSS/BLUE SHIELD | Admitting: Internal Medicine

## 2017-09-28 ENCOUNTER — Other Ambulatory Visit (INDEPENDENT_AMBULATORY_CARE_PROVIDER_SITE_OTHER): Payer: BLUE CROSS/BLUE SHIELD

## 2017-09-28 ENCOUNTER — Encounter: Payer: Self-pay | Admitting: Internal Medicine

## 2017-09-28 VITALS — BP 140/74 | HR 74 | Temp 97.5°F | Resp 21 | Ht 62.0 in | Wt 162.0 lb

## 2017-09-28 DIAGNOSIS — K2980 Duodenitis without bleeding: Secondary | ICD-10-CM | POA: Diagnosis not present

## 2017-09-28 DIAGNOSIS — D124 Benign neoplasm of descending colon: Secondary | ICD-10-CM

## 2017-09-28 DIAGNOSIS — D509 Iron deficiency anemia, unspecified: Secondary | ICD-10-CM

## 2017-09-28 DIAGNOSIS — D123 Benign neoplasm of transverse colon: Secondary | ICD-10-CM

## 2017-09-28 LAB — CBC WITH DIFFERENTIAL/PLATELET
BASOS PCT: 0.3 % (ref 0.0–3.0)
Basophils Absolute: 0 10*3/uL (ref 0.0–0.1)
EOS ABS: 0.2 10*3/uL (ref 0.0–0.7)
EOS PCT: 1.6 % (ref 0.0–5.0)
HCT: 39.7 % (ref 36.0–46.0)
HEMOGLOBIN: 13.3 g/dL (ref 12.0–15.0)
Lymphocytes Relative: 25 % (ref 12.0–46.0)
Lymphs Abs: 2.5 10*3/uL (ref 0.7–4.0)
MCHC: 33.5 g/dL (ref 30.0–36.0)
MCV: 91.3 fl (ref 78.0–100.0)
MONO ABS: 1 10*3/uL (ref 0.1–1.0)
Monocytes Relative: 10.1 % (ref 3.0–12.0)
Neutro Abs: 6.4 10*3/uL (ref 1.4–7.7)
Neutrophils Relative %: 63 % (ref 43.0–77.0)
Platelets: 418 10*3/uL — ABNORMAL HIGH (ref 150.0–400.0)
RBC: 4.35 Mil/uL (ref 3.87–5.11)
RDW: 15.7 % — ABNORMAL HIGH (ref 11.5–15.5)
WBC: 10.2 10*3/uL (ref 4.0–10.5)

## 2017-09-28 LAB — FERRITIN: FERRITIN: 18.3 ng/mL (ref 10.0–291.0)

## 2017-09-28 MED ORDER — SODIUM CHLORIDE 0.9 % IV SOLN: 500.0000 mL | Freq: Once | INTRAVENOUS | Status: AC

## 2017-09-28 NOTE — Progress Notes (Signed)
Called to room to assist during endoscopic procedure.  Patient ID and intended procedure confirmed with present staff. Received instructions for my participation in the procedure from the performing physician.  

## 2017-09-28 NOTE — Op Note (Signed)
[facility] Patient Name: Kelsey White Procedure Date: 09/28/2017 1:28 PM MRN: [id number] Endoscopist: Docia Chuck. Henrene Pastor , MD Age: 62 Referring MD:  Date of Birth: 01-21-1956 Gender: Female Account #: [id number] Procedure:                Upper GI endoscopy, with biopsies Indications:              Iron deficiency anemia Medicines:                Monitored Anesthesia Care Procedure:                Pre-Anesthesia Assessment:                           - Prior to the procedure, a History and Physical                            was performed, and patient medications and                            allergies were reviewed. The patient's tolerance of                            previous anesthesia was also reviewed. The risks                            and benefits of the procedure and the sedation                            options and risks were discussed with the patient.                            All questions were answered, and informed consent                            was obtained. Prior Anticoagulants: The patient has                            taken no previous anticoagulant or antiplatelet                            agents. ASA Grade Assessment: II - A patient with                            mild systemic disease. After reviewing the risks                            and benefits, the patient was deemed in                            satisfactory condition to undergo the procedure.                           After obtaining informed consent, the endoscope was  passed under direct vision. Throughout the                            procedure, the patient's blood pressure, pulse, and                            oxygen saturations were monitored continuously. The                            Endoscope was introduced through the mouth, and                            advanced to the second part of duodenum. The upper                            GI endoscopy was  accomplished without difficulty.                            The patient tolerated the procedure well. Scope In: Scope Out: Findings:                 The esophagus mild esophagitis as manifested by                            edema and erythema at the mucosal Z line. No other                            abnormalities.                           The stomach was normal.                           Patchy mild inflammation characterized by erythema                            was found in the duodenal bulb. Biopsies were taken                            with a cold forceps for Helicobacter pylori testing                            using CLOtest.                           The examined duodenum was otherwise normal.                           The cardia and gastric fundus were normal on                            retroflexion. Complications:            No immediate complications. Estimated Blood Loss:     Estimated blood loss: none. Impression:  1. Mild esophagitis                           2. Mild duodenitis. Status post CLO biopsy                           3. Otherwise normal exam. Recommendation:           1. CBC and ferritin level today                           2. Resume iron therapy                           3. Schedule capsule endoscopy as outpatient "iron                            deficiency anemia, evaluate for small bowel                            lesions".                           4. Office follow-up with Dr. Henrene Pastor after all of the                            above completed. Docia Chuck. Henrene Pastor, MD 09/28/2017 2:12:11 PM This report has been signed electronically.

## 2017-09-28 NOTE — Progress Notes (Signed)
Report to PACU, RN, vss, BBS= Clear.  

## 2017-09-28 NOTE — Op Note (Signed)
[facility] Patient Name: Kelsey White Procedure Date: 09/28/2017 1:28 PM MRN: [id number] Endoscopist: Docia Chuck. Henrene Pastor , MD Age: 62 Referring MD:  Date of Birth: [birth year] Gender: Female Account #: [id number] Procedure:                Colonoscopy, with cold snare polypectomy x 2 Indications:              Iron deficiency anemia Medicines:                Monitored Anesthesia Care Procedure:                Pre-Anesthesia Assessment:                           - Prior to the procedure, a History and Physical                            was performed, and patient medications and                            allergies were reviewed. The patient's tolerance of                            previous anesthesia was also reviewed. The risks                            and benefits of the procedure and the sedation                            options and risks were discussed with the patient.                            All questions were answered, and informed consent                            was obtained. Prior Anticoagulants: The patient has                            taken no previous anticoagulant or antiplatelet                            agents. ASA Grade Assessment: II - A patient with                            mild systemic disease. After reviewing the risks                            and benefits, the patient was deemed in                            satisfactory condition to undergo the procedure.                           After obtaining informed consent, the colonoscope  was passed under direct vision. Throughout the                            procedure, the patient's blood pressure, pulse, and                            oxygen saturations were monitored continuously. The                            Colonoscope was introduced through the anus and                            advanced to the the cecum, identified by                            appendiceal  orifice and ileocecal valve. The                            terminal ileum, ileocecal valve, appendiceal                            orifice, and rectum were photographed. The quality                            of the bowel preparation was excellent. The                            colonoscopy was performed without difficulty. The                            patient tolerated the procedure well. The bowel                            preparation used was SUPREP. Scope In: 1:33:33 PM Scope Out: 1:56:11 PM Scope Withdrawal Time: 0 hours 18 minutes 18 seconds  Total Procedure Duration: 0 hours 22 minutes 38 seconds  Findings:                 The terminal ileum appeared normal.                           Two polyps were found in the descending colon and                            transverse colon. The polyps were 3 to 4 mm in                            size. These polyps were removed with a cold snare.                            Resection and retrieval were complete.                           Multiple small and large-mouthed diverticula were  found in the left colon.                           The exam was otherwise without abnormality on                            direct and retroflexion views. Complications:            No immediate complications. Estimated blood loss:                            None. Estimated Blood Loss:     Estimated blood loss: none. Impression:               - The examined portion of the ileum was normal.                           - Two 3 to 4 mm polyps in the descending colon and                            in the transverse colon, removed with a cold snare.                            Resected and retrieved.                           - Diverticulosis in the left colon.                           - The examination was otherwise normal on direct                            and retroflexion views.                           - EGD today. Please see  report Recommendation:           - Repeat colonoscopy in 5 years for surveillance.                           - Patient has a contact number available for                            emergencies. The signs and symptoms of potential                            delayed complications were discussed with the                            patient. Return to normal activities tomorrow.                            Written discharge instructions were provided to the                            patient.                           -  Resume previous diet.                           - Continue present medications.                           - Await pathology results. Docia Chuck. Henrene Pastor, MD 09/28/2017 2:07:49 PM This report has been signed electronically.

## 2017-09-28 NOTE — Patient Instructions (Signed)
Information on polyps,diverticulosis,and esophagitis given to you today  Await pathology results on polyps removed today  labwork (CBC & Ferritin level) will be done today   Capsule Endoscopy will be scheduled by Dr Pearletha Furl office nurse and you will be called with time date and details  Need office follow up with Dr Henrene Pastor after all this completed     YOU HAD AN ENDOSCOPIC PROCEDURE TODAY AT [facility]:   Refer to the procedure report that was given to you for any specific questions about what was found during the examination.  If the procedure report does not answer your questions, please call your gastroenterologist to clarify.  If you requested that your care partner not be given the details of your procedure findings, then the procedure report has been included in a sealed envelope for you to review at your convenience later.  YOU SHOULD EXPECT: Some feelings of bloating in the abdomen. Passage of more gas than usual.  Walking can help get rid of the air that was put into your GI tract during the procedure and reduce the bloating. If you had a lower endoscopy (such as a colonoscopy or flexible sigmoidoscopy) you may notice spotting of blood in your stool or on the toilet paper. If you underwent a bowel prep for your procedure, you may not have a normal bowel movement for a few days.  Please Note:  You might notice some irritation and congestion in your nose or some drainage.  This is from the oxygen used during your procedure.  There is no need for concern and it should clear up in a day or so.  SYMPTOMS TO REPORT IMMEDIATELY:   Following lower endoscopy (colonoscopy or flexible sigmoidoscopy):  Excessive amounts of blood in the stool  Significant tenderness or worsening of abdominal pains  Swelling of the abdomen that is new, acute  Fever of 100F or higher   Following upper endoscopy (EGD)  Vomiting of blood or coffee ground material  New chest pain or pain under  the shoulder blades  Painful or persistently difficult swallowing  New shortness of breath  Fever of 100F or higher  Black, tarry-looking stools  For urgent or emergent issues, a gastroenterologist can be reached at any hour by calling [phone].   DIET:  We do recommend a small meal at first, but then you may proceed to your regular diet.  Drink plenty of fluids but you should avoid alcoholic beverages for 24 hours.  ACTIVITY:  You should plan to take it easy for the rest of today and you should NOT DRIVE or use heavy machinery until tomorrow (because of the sedation medicines used during the test).    FOLLOW UP: Our staff will call the number listed on your records the next business day following your procedure to check on you and address any questions or concerns that you may have regarding the information given to you following your procedure. If we do not reach you, we will leave a message.  However, if you are feeling well and you are not experiencing any problems, there is no need to return our call.  We will assume that you have returned to your regular daily activities without incident.  If any biopsies were taken you will be contacted by phone or by letter within the next 1-3 weeks.  Please call us at [phone] if you have not heard about the biopsies in 3 weeks.    SIGNATURES/CONFIDENTIALITY: You and/or your care  partner have signed paperwork which will be entered into your electronic medical record.  These signatures attest to the fact that that the information above on your After Visit Summary has been reviewed and is understood.  Full responsibility of the confidentiality of this discharge information lies with you and/or your care-partner.

## 2017-09-29 ENCOUNTER — Telehealth: Payer: Self-pay | Admitting: *Deleted

## 2017-09-29 LAB — HELICOBACTER PYLORI SCREEN-BIOPSY: UREASE: NEGATIVE

## 2017-09-29 NOTE — Telephone Encounter (Signed)
  Follow up Call-  Call back number 09/28/2017  Post procedure Call Back phone  # [phone]  Permission to leave phone message Yes     Patient questions:  Do you have a fever, pain , or abdominal swelling? No. Pain Score  0 *  Have you tolerated food without any problems? Yes.    Have you been able to return to your normal activities? Yes.    Do you have any questions about your discharge instructions: Diet   No. Medications  No. Follow up visit  No.  Do you have questions or concerns about your Care? No.  Actions: * If pain score is 4 or above: No action needed, pain <4.

## 2017-10-04 ENCOUNTER — Encounter: Payer: Self-pay | Admitting: Internal Medicine

## 2017-10-08 ENCOUNTER — Other Ambulatory Visit: Payer: Self-pay | Admitting: Medical

## 2017-10-09 NOTE — Telephone Encounter (Signed)
Is this ok to refill?  

## 2017-10-18 ENCOUNTER — Other Ambulatory Visit: Payer: Self-pay

## 2017-10-18 DIAGNOSIS — D509 Iron deficiency anemia, unspecified: Secondary | ICD-10-CM

## 2017-10-20 ENCOUNTER — Telehealth: Payer: Self-pay | Admitting: Medical

## 2017-10-20 ENCOUNTER — Other Ambulatory Visit: Payer: Self-pay | Admitting: Medical

## 2017-10-20 MED ORDER — HYDROCODONE-ACETAMINOPHEN 5-325 MG PO TABS: ORAL_TABLET | ORAL | 0 refills | Status: AC

## 2017-10-20 NOTE — Telephone Encounter (Signed)
Please pull a controlled substance report on her

## 2017-10-20 NOTE — Telephone Encounter (Signed)
Is this ok to refill?  

## 2017-10-23 ENCOUNTER — Telehealth: Payer: Self-pay | Admitting: Internal Medicine

## 2017-10-23 NOTE — Telephone Encounter (Signed)
Pts capsule rescheduled to 11/06/17, pt aware of appt.

## 2017-10-23 NOTE — Telephone Encounter (Signed)
Left message for pt to call back  °

## 2017-10-24 ENCOUNTER — Telehealth: Payer: Self-pay | Admitting: Medical

## 2017-10-24 NOTE — Telephone Encounter (Signed)
Of note I reviewed the [place] and a controlled substance registry.  We did refer her recently to [facility] for further evaluation of her back issues.  She saw Dr. Brien Few, and he apparently gave her 120 hydrocodone on October 03, 2017.  I do not believe she is called our office to inform us of this however she did refill a supply from me of 60 hydrocodone 10/20/2017.  We will need to discuss this at her next visit.  She will need to get her pain medicine from Dr. Brien Few going forward

## 2017-10-31 ENCOUNTER — Other Ambulatory Visit: Payer: Self-pay | Admitting: Medical

## 2017-11-03 ENCOUNTER — Telehealth: Payer: Self-pay | Admitting: Internal Medicine

## 2017-11-03 ENCOUNTER — Other Ambulatory Visit: Payer: Self-pay

## 2017-11-03 DIAGNOSIS — D509 Iron deficiency anemia, unspecified: Secondary | ICD-10-CM

## 2017-11-03 NOTE — Telephone Encounter (Signed)
As there is no definitive cause for this patient's iron deficiency anemia, based on endoscopic evaluations and history, and the insurance company is denying capsule endoscopy, which would be the best next study, then set her up for small bowel follow-through "iron deficiency anemia, rule out small bowel lesion"

## 2017-11-03 NOTE — Telephone Encounter (Signed)
Capsule has not been approved, appt cancelled and pt aware.

## 2017-11-03 NOTE — Telephone Encounter (Signed)
Pt scheduled for SBFT at [facility] 11/20/17@9 :30am, pt to arrive there at 9:15am. Pt to be NPO after midnight. Pt aware of appt.

## 2017-11-20 ENCOUNTER — Ambulatory Visit (HOSPITAL_COMMUNITY)
Admission: RE | Admit: 2017-11-20 | Discharge: 2017-11-20 | Disposition: A | Discharge status: Home / Self Care | Payer: BLUE CROSS/BLUE SHIELD | Source: Ambulatory Visit | Attending: Internal Medicine | Admitting: Internal Medicine

## 2017-11-20 DIAGNOSIS — D509 Iron deficiency anemia, unspecified: Secondary | ICD-10-CM | POA: Insufficient documentation

## 2017-11-26 ENCOUNTER — Other Ambulatory Visit: Payer: Self-pay | Admitting: Medical

## 2017-11-27 NOTE — Telephone Encounter (Signed)
Is this ok to refill?  

## 2017-11-29 ENCOUNTER — Other Ambulatory Visit: Payer: Self-pay | Admitting: Medical

## 2017-11-30 ENCOUNTER — Ambulatory Visit: Payer: Self-pay | Admitting: Medical

## 2017-12-05 ENCOUNTER — Ambulatory Visit: Payer: BLUE CROSS/BLUE SHIELD | Admitting: Internal Medicine

## 2017-12-26 ENCOUNTER — Telehealth: Payer: Self-pay

## 2017-12-26 ENCOUNTER — Ambulatory Visit: Payer: BLUE CROSS/BLUE SHIELD | Admitting: Medical

## 2017-12-26 ENCOUNTER — Encounter: Payer: Self-pay | Admitting: Medical

## 2017-12-26 VITALS — BP 116/74 | HR 80 | Temp 97.9°F | Resp 16 | Ht 62.0 in | Wt 145.6 lb

## 2017-12-26 DIAGNOSIS — M549 Dorsalgia, unspecified: Secondary | ICD-10-CM

## 2017-12-26 DIAGNOSIS — F17200 Nicotine dependence, unspecified, uncomplicated: Secondary | ICD-10-CM

## 2017-12-26 DIAGNOSIS — Z1239 Encounter for other screening for malignant neoplasm of breast: Secondary | ICD-10-CM

## 2017-12-26 DIAGNOSIS — E785 Hyperlipidemia, unspecified: Secondary | ICD-10-CM

## 2017-12-26 DIAGNOSIS — G4700 Insomnia, unspecified: Secondary | ICD-10-CM

## 2017-12-26 DIAGNOSIS — E118 Type 2 diabetes mellitus with unspecified complications: Secondary | ICD-10-CM | POA: Diagnosis not present

## 2017-12-26 DIAGNOSIS — F418 Other specified anxiety disorders: Secondary | ICD-10-CM | POA: Diagnosis not present

## 2017-12-26 DIAGNOSIS — I10 Essential (primary) hypertension: Secondary | ICD-10-CM | POA: Diagnosis not present

## 2017-12-26 DIAGNOSIS — Z1231 Encounter for screening mammogram for malignant neoplasm of breast: Secondary | ICD-10-CM

## 2017-12-26 DIAGNOSIS — M8580 Other specified disorders of bone density and structure, unspecified site: Secondary | ICD-10-CM

## 2017-12-26 DIAGNOSIS — Z122 Encounter for screening for malignant neoplasm of respiratory organs: Secondary | ICD-10-CM

## 2017-12-26 LAB — COMPREHENSIVE METABOLIC PANEL
ALBUMIN: 4.6 g/dL (ref 3.6–4.8)
ALT: 17 IU/L (ref 0–32)
AST: 18 IU/L (ref 0–40)
Albumin/Globulin Ratio: 2 (ref 1.2–2.2)
Alkaline Phosphatase: 75 IU/L (ref 39–117)
BILIRUBIN TOTAL: 0.3 mg/dL (ref 0.0–1.2)
BUN / CREAT RATIO: 18 (ref 12–28)
BUN: 16 mg/dL (ref 8–27)
CALCIUM: 10.6 mg/dL — AB (ref 8.7–10.3)
CO2: 23 mmol/L (ref 20–29)
Chloride: 95 mmol/L — ABNORMAL LOW (ref 96–106)
Creatinine, Ser: 0.89 mg/dL (ref 0.57–1.00)
GFR, EST AFRICAN AMERICAN: 81 mL/min/{1.73_m2} (ref >59)
GFR, EST NON AFRICAN AMERICAN: 71 mL/min/{1.73_m2} (ref >59)
Globulin, Total: 2.3 g/dL (ref 1.5–4.5)
Glucose: 100 mg/dL — ABNORMAL HIGH (ref 65–99)
Potassium: 4.8 mmol/L (ref 3.5–5.2)
Sodium: 137 mmol/L (ref 134–144)
TOTAL PROTEIN: 6.9 g/dL (ref 6.0–8.5)

## 2017-12-26 LAB — HEMOGLOBIN A1C
Est. average glucose Bld gHb Est-mCnc: 120 mg/dL
Hgb A1c MFr Bld: 5.8 % — ABNORMAL HIGH (ref 4.8–5.6)

## 2017-12-26 MED ORDER — HYDROCHLOROTHIAZIDE 25 MG PO TABS: 25.0000 mg | ORAL_TABLET | Freq: Every day | ORAL | 1 refills | Status: AC

## 2017-12-26 MED ORDER — FLUTICASONE PROPIONATE 50 MCG/ACT NA SUSP: NASAL | 2 refills | Status: DC

## 2017-12-26 MED ORDER — ESCITALOPRAM OXALATE 20 MG PO TABS: 20.0000 mg | ORAL_TABLET | Freq: Every day | ORAL | 1 refills | Status: AC

## 2017-12-26 MED ORDER — ZOLPIDEM TARTRATE 10 MG PO TABS: 10.0000 mg | ORAL_TABLET | Freq: Every evening | ORAL | 2 refills | Status: DC | PRN

## 2017-12-26 MED ORDER — ARIPIPRAZOLE 5 MG PO TABS: 5.0000 mg | ORAL_TABLET | Freq: Every day | ORAL | 1 refills | Status: AC

## 2017-12-26 MED ORDER — LOSARTAN POTASSIUM 100 MG PO TABS: 100.0000 mg | ORAL_TABLET | Freq: Every day | ORAL | 1 refills | Status: AC

## 2017-12-26 MED ORDER — ATENOLOL 100 MG PO TABS: 100.0000 mg | ORAL_TABLET | Freq: Every day | ORAL | 1 refills | Status: AC

## 2017-12-26 NOTE — Patient Instructions (Signed)
Recommendations:  Check insurnace coverage for lung cancer screening  Please call to schedule your mammogram  [facility]  [phone] [address], [place], [place] [address]

## 2017-12-26 NOTE — Telephone Encounter (Signed)
[employer] did not approve CT for lung screening.   You may call them to speak with reviewer at [phone].

## 2017-12-26 NOTE — Progress Notes (Signed)
Subjective: Chief Complaint  Patient presents with  . DM Check    DM check sugar running 115-145   Here for diabetes f/u.   Diabetes: Current treatments: Trulicity 2.95 weekly, Metformin 1000mg  BID Medication compliance: excellent  Patient is checking home blood sugars.   Home blood sugar records: 115-145 Current symptoms include: none. Patient denies foot ulcerations, hyperglycemia, hypoglycemia , nausea, polydipsia and polyuria.  Patient is checking their feet daily. Foot concerns (callous, ulcer, wound, thickened nails, toenail fungus, skin fungus, hammer toe): none Current diet not as good as before given the new job. Current exercise: walking the dogs, but started working 3rd shift job, so not as much exercise.      When she moved here this past year, worked 4 months until that company moved to [place].   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    Was wanting to increase Ambien to 10mg .  Not sleeping well.   Can't stay asleep.    HTN - compliant with Atenolol 100mg  daily, Losartan 100mg  and HCTZ 25mg  daily  Hyperlipidemia - compliant with Lipitor 40mg  daily, aspirin 81mg  daily  Sees GI Dr. Henrene Pastor again soon.  Recently had EGD and Colonoscopy.    Lives with her daughter who cooks some of the meals.  She has lost some weight recently that she attributes to some mild weight loss, not eating as much as before.  Her typical weight is 150lb.  Last mammogram 1.5 year ago, pap same time.    Having some pain lately in upper back, worse on right.  Sees pain specialist.    Past Medical History:  Diagnosis Date  . Anemia   . Anxiety   . Diabetes mellitus without complication ([place]) 1884  . History of stress test 10/2012   stress test and echo  . Hyperlipidemia 2008  . Hypertension 1985  . Lumbar foraminal stenosis    herniated disc, spondylosis; Dr. Marlaine Hind 08/2017  . PVC (premature ventricular contraction) 10/2012   cardiology eval for Asymptomatic PVCs controlled on beta-blocker.   Dr. Trish Mage   Current Outpatient Medications on File Prior to Visit  Medication Sig Dispense Refill  . aspirin EC 81 MG tablet Take 81 mg by mouth daily.    Marland Kitchen atorvastatin (LIPITOR) 40 MG tablet Take 1 tablet (40 mg total) by mouth daily. 90 tablet 3  . Cholecalciferol (VITAMIN D PO) Take by mouth.    . ferrous gluconate (FERGON) 324 MG tablet TAKE 1 TABLET BY MOUTH TWICE DAILY WITH A MEAL 60 tablet 2  . HYDROcodone-acetaminophen (NORCO/VICODIN) 5-325 MG tablet TAKE 1 TO 2 TABLETS EVERY 12 HOURS AS NEEDED FOR SEVERE PAIN 60 tablet 0  . MAGNESIUM PO Take by mouth.    . metFORMIN (GLUCOPHAGE) 1000 MG tablet Take 1 tablet (1,000 mg total) by mouth 2 (two) times daily with a meal. 180 tablet 3  . tiZANidine (ZANAFLEX) 4 MG tablet TAKE 1/2 TABLET BY MOUTH TWICE DAILY AS NEEDED FOR SPASMS 30 tablet 2  . TRULICITY 1.66 AY/3.0ZS SOPN 0.75 MG ONCE A WEEK SUBCUTANEOUSLY 90 DAYS 1.5 mL 2  . amoxicillin (AMOXIL) 875 MG tablet Take 1 tablet (875 mg total) by mouth 2 (two) times daily. (Patient not taking: Reported on 12/26/2017) 20 tablet 0  . promethazine-dextromethorphan (PROMETHAZINE-DM) 6.25-15 MG/5ML syrup Take 5 mLs by mouth 4 (four) times daily as needed for cough. (Patient not taking: Reported on 12/26/2017) 120 mL 0   Current Facility-Administered Medications on File Prior to Visit  Medication Dose Route Frequency  Provider Last Rate Last Dose  . 0.9 %  sodium chloride infusion  500 mL Intravenous Once Irene Shipper, MD       ROS as in subjective    Objective BP 116/74   Pulse 80   Temp 97.9 F (36.6 C) (Oral)   Resp 16   Ht 5\' 2"  (1.575 m)   Wt 145 lb 9.6 oz (66 kg)   SpO2 98%   BMI 26.63 kg/m   Wt Readings from Last 3 Encounters:  12/26/17 145 lb 9.6 oz (66 kg)  09/28/17 162 lb (73.5 kg)  09/12/17 152 lb 6.4 oz (69.1 kg)   BP Readings from Last 3 Encounters:  12/26/17 116/74  09/28/17 140/74  09/12/17 100/68    General appearance: alert, no distress, WD/WN,  Neck:  supple, no lymphadenopathy, no thyromegaly, no masses Heart: RRR, normal S1, S2, no murmurs Lungs: CTA bilaterally, no wheezes, rhonchi, or rales Pulses: 2+ symmetric, upper and lower extremities, normal cap refill Ext: no edema   Diabetic Foot Exam - Simple   Simple Foot Form Diabetic Foot exam was performed with the following findings:  Yes 12/26/2017  9:16 AM  Visual Inspection No deformities, no ulcerations, no other skin breakdown bilaterally:  Yes Sensation Testing Intact to touch and monofilament testing bilaterally:  Yes Pulse Check See comments:  Yes Comments 1+ pedal pulses        Assessment: Encounter Diagnoses  Name Primary?  . Diabetes mellitus with complication ([place]) Yes  . Essential hypertension, benign   . Depression with anxiety   . Hyperlipidemia, unspecified hyperlipidemia type   . Insomnia, unspecified type   . Smoker   . Osteopenia, unspecified location   . Mid back pain   . Encounter for screening for lung cancer   . Screening for breast cancer   . Encounter for screening for malignant neoplasm of respiratory organs      Plan: Diabetes- Discussed strict diet, regular exercise, continue current medications.  Labs today.  Continue daily foot checks, routine glucose monitoring.  Hypertension-continue same medication  Hyperlipidemia-continue same medication  Smoking-advise she stop smoking, discussed risk of tobacco use  Advised she call and schedule mammogram, return for pap smear  Call insurance about coverage for CT chest  insomnia - increase Ambien to 10mg  for now but discussed risks/benefits.   Use prn, preferably not daily  Follow-up in 3 months   Kelsey White was seen today for dm check.  Diagnoses and all orders for this visit:  Diabetes mellitus with complication ([place]) -     Comprehensive metabolic panel -     Hemoglobin A1c -     HM DIABETES FOOT EXAM -     HM DIABETES EYE EXAM  Essential hypertension, benign  Depression with  anxiety  Hyperlipidemia, unspecified hyperlipidemia type  Insomnia, unspecified type  Smoker  Osteopenia, unspecified location  Mid back pain  Encounter for screening for lung cancer  Screening for breast cancer -     MM DIGITAL SCREENING BILATERAL; Future  Encounter for screening for malignant neoplasm of respiratory organs -     CT CHEST LUNG CA SCREEN LOW DOSE W/O CM; Future  Other orders -     ARIPiprazole (ABILIFY) 5 MG tablet; Take 1 tablet (5 mg total) by mouth daily. -     atenolol (TENORMIN) 100 MG tablet; Take 1 tablet (100 mg total) by mouth daily. -     escitalopram (LEXAPRO) 20 MG tablet; Take 1 tablet (20 mg total) by  mouth daily. -     fluticasone (FLONASE) 50 MCG/ACT nasal spray; PLACE 1 SPRAY INTO EACH NOSTRIL EVERY DAY -     hydrochlorothiazide (HYDRODIURIL) 25 MG tablet; Take 1 tablet (25 mg total) by mouth daily. -     losartan (COZAAR) 100 MG tablet; Take 1 tablet (100 mg total) by mouth daily. -     zolpidem (AMBIEN) 10 MG tablet; Take 1 tablet (10 mg total) by mouth at bedtime as needed for sleep.

## 2017-12-28 ENCOUNTER — Other Ambulatory Visit: Payer: Self-pay | Admitting: Medical

## 2017-12-28 DIAGNOSIS — Z720 Tobacco use: Secondary | ICD-10-CM

## 2017-12-28 MED ORDER — TRULICITY 0.75 MG/0.5ML ~~LOC~~ SOAJ: SUBCUTANEOUS | 5 refills | Status: AC

## 2018-01-01 ENCOUNTER — Ambulatory Visit
Admission: RE | Admit: 2018-01-01 | Discharge: 2018-01-01 | Disposition: A | Discharge status: Home / Self Care | Payer: BLUE CROSS/BLUE SHIELD | Source: Ambulatory Visit | Attending: Medical | Admitting: Medical

## 2018-01-01 ENCOUNTER — Other Ambulatory Visit: Payer: Self-pay | Admitting: Medical

## 2018-01-01 DIAGNOSIS — F17200 Nicotine dependence, unspecified, uncomplicated: Secondary | ICD-10-CM

## 2018-01-01 DIAGNOSIS — R0689 Other abnormalities of breathing: Secondary | ICD-10-CM

## 2018-01-01 DIAGNOSIS — R05 Cough: Secondary | ICD-10-CM

## 2018-01-01 DIAGNOSIS — R059 Cough, unspecified: Secondary | ICD-10-CM

## 2018-01-09 ENCOUNTER — Encounter: Payer: Self-pay | Admitting: Internal Medicine

## 2018-01-09 ENCOUNTER — Ambulatory Visit: Payer: BLUE CROSS/BLUE SHIELD | Admitting: Internal Medicine

## 2018-01-09 ENCOUNTER — Other Ambulatory Visit (INDEPENDENT_AMBULATORY_CARE_PROVIDER_SITE_OTHER): Payer: BLUE CROSS/BLUE SHIELD

## 2018-01-09 VITALS — BP 128/70 | HR 84 | Ht 62.0 in | Wt 147.1 lb

## 2018-01-09 DIAGNOSIS — Z86010 Personal history of colonic polyps: Secondary | ICD-10-CM | POA: Diagnosis not present

## 2018-01-09 DIAGNOSIS — K209 Esophagitis, unspecified: Secondary | ICD-10-CM | POA: Diagnosis not present

## 2018-01-09 DIAGNOSIS — D509 Iron deficiency anemia, unspecified: Secondary | ICD-10-CM

## 2018-01-09 LAB — CBC WITH DIFFERENTIAL/PLATELET
BASOS ABS: 0.1 10*3/uL (ref 0.0–0.1)
Basophils Relative: 0.6 % (ref 0.0–3.0)
Eosinophils Absolute: 0.2 10*3/uL (ref 0.0–0.7)
Eosinophils Relative: 2.4 % (ref 0.0–5.0)
HCT: 39.3 % (ref 36.0–46.0)
Hemoglobin: 13.2 g/dL (ref 12.0–15.0)
LYMPHS ABS: 2 10*3/uL (ref 0.7–4.0)
Lymphocytes Relative: 23.1 % (ref 12.0–46.0)
MCHC: 33.6 g/dL (ref 30.0–36.0)
MCV: 94 fl (ref 78.0–100.0)
MONOS PCT: 7.7 % (ref 3.0–12.0)
Monocytes Absolute: 0.7 10*3/uL (ref 0.1–1.0)
NEUTROS ABS: 5.7 10*3/uL (ref 1.4–7.7)
NEUTROS PCT: 66.2 % (ref 43.0–77.0)
PLATELETS: 397 10*3/uL (ref 150.0–400.0)
RBC: 4.18 Mil/uL (ref 3.87–5.11)
RDW: 13 % (ref 11.5–15.5)
WBC: 8.6 10*3/uL (ref 4.0–10.5)

## 2018-01-09 LAB — FERRITIN: FERRITIN: 19.7 ng/mL (ref 10.0–291.0)

## 2018-01-09 NOTE — Patient Instructions (Signed)
Your provider has requested that you go to the basement level for lab work before leaving today. Press "B" on the elevator. The lab is located at the first door on the left as you exit the elevator.  

## 2018-01-09 NOTE — Progress Notes (Signed)
HISTORY OF PRESENT ILLNESS:  Kelsey White is a 62 y.o. female who was evaluated in this office Aug 07, 2017 regarding asymptomatic iron deficiency anemia.  See that dictation for details.  Testing for celiac disease was negative.  She subsequently underwent colonoscopy and upper endoscopy on September 28, 2017.  Colonoscopy revealed diverticulosis and diminutive colon polyps which were removed.  These were found to be adenomatous.  The terminal ileum was normal.  Follow-up colonoscopy in 5 years recommended.  Upper endoscopy revealed mild esophagitis and patchy erythema in the duodenal bulb.  Otherwise normal.  Testing for Helicobacter pylori was negative.  Capsule endoscopy was ordered to evaluate for small bowel lesions as a cause for iron deficiency anemia.  Unfortunately, this was denied by her insurance company.  Thus, a small bowel follow-through was ordered to rule out gross abnormalities.  This was performed November 20, 2017 and found to be normal.  She has continued on oral iron therapy twice daily as previously directed.  Her last CBC was performed September 28, 2017.  Hemoglobin was normal at 13.3.  Platelets elevated at 418,000.  MCV 91.3.  Ferritin level 18.3.  Patient's GI review of systems is entirely negative.  She tolerates iron.  She is accompanied today by her daughter.  REVIEW OF SYSTEMS:  All non-GI ROS negative unless otherwise stated in the HPI except for back pain, anxiety, depression  Past Medical History:  Diagnosis Date  . Anemia   . Anxiety   . Diabetes mellitus without complication ([place]) 2979  . History of stress test 10/2012   stress test and echo  . Hyperlipidemia 2008  . Hypertension 1985  . Lumbar foraminal stenosis    herniated disc, spondylosis; Dr. Marlaine Hind 08/2017  . PVC (premature ventricular contraction) 10/2012   cardiology eval for Asymptomatic PVCs controlled on beta-blocker.  Dr. Trish Mage    Past Surgical History:  Procedure Laterality Date  . ANKLE  FRACTURE SURGERY Left 1993  . CLAVICLE SURGERY Left 2011  . HIP FRACTURE SURGERY Right 2018    Social History Kelsey White  reports that she has been smoking cigarettes. She has been smoking about 1.00 pack per day. She has never used smokeless tobacco. She reports that she drinks about 1.0 standard drinks of alcohol per week. She reports that she does not use drugs.  family history includes Diabetes in her father and sister; Hypertension in her father, mother, and sister; Hypothyroidism in her daughter, sister, and sister; Lung cancer in her father.  No Known Allergies     PHYSICAL EXAMINATION: Vital signs: BP 128/70 (BP Location: Left Arm, Patient Position: Sitting, Cuff Size: Normal)   Pulse 84   Ht 5\' 2"  (1.575 m)   Wt 147 lb 2 oz (66.7 kg)   BMI 26.91 kg/m   Constitutional: generally well-appearing, no acute distress Psychiatric: alert and oriented x3, cooperative Eyes:  conjunctiva pink Abdomen: Not reexamined Extremities: no lower extremity edema bilaterally Skin: Good color.  No lesions on visible extremities Neuro: Alert and oriented  ASSESSMENT:  1.  Iron deficiency anemia.  Resolved with iron replacement therapy.  No significant GI mucosal lesions on colonoscopy or upper endoscopy.  Small bowel follow-through negative for gross abnormalities.  She may have small bowel AVMs or NSAID induced small bowel erosions either which which would elude diagnosis without capsule endoscopy (denied by insurance).  Fortunately she is feeling well and asymptomatic from a GI perspective 2.  Adenomatous colon polyps 3.  Mild esophagitis.  Asymptomatic  PLAN:  1.  Repeat CBC and ferritin today.  We will contact the patient with the results 2.  Continue iron therapy to avoid recurrent iron deficiency anemia 3.  Repeat surveillance colonoscopy in 5 years 4.  Resume care with PCP  ADDENDUM CBC and ferritin have returned normal.  Normal platelets.  We have contacted the patient with  these results and I have asked her to continue iron ONCE daily indefinitely.  She will return to the care of her PCP  15 minutes spent face-to-face with the patient.  Greater than 50% the time is for counseling regarding her iron deficiency anemia

## 2018-01-30 ENCOUNTER — Ambulatory Visit
Admission: RE | Admit: 2018-01-30 | Discharge: 2018-01-30 | Disposition: A | Discharge status: Home / Self Care | Payer: BLUE CROSS/BLUE SHIELD | Source: Ambulatory Visit | Attending: Medical | Admitting: Medical

## 2018-01-30 DIAGNOSIS — Z1239 Encounter for other screening for malignant neoplasm of breast: Secondary | ICD-10-CM

## 2018-02-07 ENCOUNTER — Encounter: Payer: Self-pay | Admitting: Medical

## 2018-02-07 ENCOUNTER — Ambulatory Visit: Payer: BLUE CROSS/BLUE SHIELD | Admitting: Medical

## 2018-02-07 VITALS — BP 132/80 | HR 86 | Temp 98.1°F | Resp 16 | Ht 62.0 in | Wt 146.8 lb

## 2018-02-07 DIAGNOSIS — R319 Hematuria, unspecified: Secondary | ICD-10-CM | POA: Diagnosis not present

## 2018-02-07 DIAGNOSIS — R110 Nausea: Secondary | ICD-10-CM

## 2018-02-07 LAB — POCT URINALYSIS DIP (PROADVANTAGE DEVICE)
BILIRUBIN UA: NEGATIVE mg/dL
Bilirubin, UA: NEGATIVE
GLUCOSE UA: NEGATIVE mg/dL
Leukocytes, UA: NEGATIVE
Nitrite, UA: NEGATIVE
Protein Ur, POC: NEGATIVE mg/dL
SPECIFIC GRAVITY, URINE: 1.01
Urobilinogen, Ur: NEGATIVE
pH, UA: 6 (ref 5.0–8.0)

## 2018-02-07 MED ORDER — ONDANSETRON HCL 4 MG PO TABS: 4.0000 mg | ORAL_TABLET | Freq: Three times a day (TID) | ORAL | 0 refills | Status: AC | PRN

## 2018-02-07 MED ORDER — NITROFURANTOIN MONOHYD MACRO 100 MG PO CAPS: 100.0000 mg | ORAL_CAPSULE | Freq: Two times a day (BID) | ORAL | 0 refills | Status: DC

## 2018-02-07 NOTE — Progress Notes (Signed)
Subjective: Chief Complaint  Patient presents with  . blood in urine    blood in the urine X yesterday   Here for blood in urine.  Yesterday urine looked cloudy, but later in evening same dark red color in urine.  No urinary urgency, no frequency, no fever.  Has a lot of nauseated.   No frank abdominal pain.   Has chronic back pain.  No fever.   No fall, no injury.  Last UTI years ago.   No hx/o kidney stone.  No other aggravating or relieving factors. No other complaint.  Past Medical History:  Diagnosis Date  . Anemia   . Anxiety   . Diabetes mellitus without complication ([place]) 9604  . History of stress test 10/2012   stress test and echo  . Hyperlipidemia 2008  . Hypertension 1985  . Lumbar foraminal stenosis    herniated disc, spondylosis; Dr. Marlaine Hind 08/2017  . PVC (premature ventricular contraction) 10/2012   cardiology eval for Asymptomatic PVCs controlled on beta-blocker.  Dr. Trish Mage   Current Outpatient Medications on File Prior to Visit  Medication Sig Dispense Refill  . ARIPiprazole (ABILIFY) 5 MG tablet Take 1 tablet (5 mg total) by mouth daily. 90 tablet 1  . aspirin EC 81 MG tablet Take 81 mg by mouth daily.    Marland Kitchen atenolol (TENORMIN) 100 MG tablet Take 1 tablet (100 mg total) by mouth daily. 90 tablet 1  . atorvastatin (LIPITOR) 40 MG tablet Take 1 tablet (40 mg total) by mouth daily. 90 tablet 3  . Cholecalciferol (VITAMIN D PO) Take by mouth.    . escitalopram (LEXAPRO) 20 MG tablet Take 1 tablet (20 mg total) by mouth daily. 90 tablet 1  . ferrous gluconate (FERGON) 324 MG tablet TAKE 1 TABLET BY MOUTH TWICE DAILY WITH A MEAL 60 tablet 2  . fluticasone (FLONASE) 50 MCG/ACT nasal spray PLACE 1 SPRAY INTO EACH NOSTRIL EVERY DAY 16 g 2  . hydrochlorothiazide (HYDRODIURIL) 25 MG tablet Take 1 tablet (25 mg total) by mouth daily. 90 tablet 1  . HYDROcodone-acetaminophen (NORCO/VICODIN) 5-325 MG tablet TAKE 1 TO 2 TABLETS EVERY 12 HOURS AS NEEDED FOR SEVERE  PAIN 60 tablet 0  . losartan (COZAAR) 100 MG tablet Take 1 tablet (100 mg total) by mouth daily. 90 tablet 1  . MAGNESIUM PO Take by mouth.    . metFORMIN (GLUCOPHAGE) 1000 MG tablet Take 1 tablet (1,000 mg total) by mouth 2 (two) times daily with a meal. 180 tablet 3  . tiZANidine (ZANAFLEX) 4 MG tablet TAKE 1/2 TABLET BY MOUTH TWICE DAILY AS NEEDED FOR SPASMS 30 tablet 2  . TRULICITY 5.40 JW/1.1BJ SOPN 0.75 MG ONCE A WEEK SUBCUTANEOUSLY 90 DAYS 1.5 mL 5  . zolpidem (AMBIEN) 10 MG tablet Take 1 tablet (10 mg total) by mouth at bedtime as needed for sleep. 30 tablet 2   Current Facility-Administered Medications on File Prior to Visit  Medication Dose Route Frequency Provider Last Rate Last Dose  . 0.9 %  sodium chloride infusion  500 mL Intravenous Once Irene Shipper, MD       ROS as in subjective   Objective: BP 132/80   Pulse 86   Temp 98.1 F (36.7 C) (Oral)   Resp 16   Ht 5\' 2"  (1.575 m)   Wt 146 lb 12.8 oz (66.6 kg)   SpO2 98%   BMI 26.85 kg/m   Gen: wd, wn, nad Skin unremarkable Abdomen: +bs, soft, mild generalized  tenderness, including mild suprapubic tenderness, no mass, no organomegaly Back: no CVA tenderness    Assessment: Encounter Diagnoses  Name Primary?  . Hematuria, unspecified type Yes  . Nausea     Plan: We will treat empirically for urinary tract infection as a likely cause of the microscopic hematuria and cloudy urine.  However if symptoms worsen in the next few days such as fever, uncontrollable vomiting, severe pain, frank blood in the urine, then get rechecked right away  Nayla was seen today for blood in urine.  Diagnoses and all orders for this visit:  Hematuria, unspecified type -     POCT Urinalysis DIP (Proadvantage Device) -     Urine Culture  Nausea  Other orders -     nitrofurantoin, macrocrystal-monohydrate, (MACROBID) 100 MG capsule; Take 1 capsule (100 mg total) by mouth 2 (two) times daily. -     ondansetron (ZOFRAN) 4 MG  tablet; Take 1 tablet (4 mg total) by mouth every 8 (eight) hours as needed for nausea or vomiting.

## 2018-02-08 ENCOUNTER — Other Ambulatory Visit: Payer: Self-pay | Admitting: Medical

## 2018-02-08 NOTE — Telephone Encounter (Signed)
Is this ok to refill?  

## 2018-02-09 LAB — URINE CULTURE

## 2018-02-20 ENCOUNTER — Other Ambulatory Visit: Payer: Self-pay | Admitting: Medical

## 2018-02-20 LAB — HM PAP SMEAR: HM Pap smear: ABNORMAL

## 2018-02-20 MED ORDER — BUTALBITAL-APAP-CAFFEINE 50-325-40 MG PO TABS: 1.0000 | ORAL_TABLET | Freq: Four times a day (QID) | ORAL | 0 refills | Status: AC | PRN

## 2018-02-23 ENCOUNTER — Other Ambulatory Visit: Payer: Self-pay | Admitting: Medical

## 2018-02-23 ENCOUNTER — Encounter: Payer: Self-pay | Admitting: Internal Medicine

## 2018-02-26 ENCOUNTER — Ambulatory Visit (INDEPENDENT_AMBULATORY_CARE_PROVIDER_SITE_OTHER): Payer: BLUE CROSS/BLUE SHIELD | Admitting: Medical

## 2018-02-26 VITALS — BP 136/80 | HR 80 | Temp 98.0°F | Resp 16 | Ht 62.0 in | Wt 143.8 lb

## 2018-02-26 DIAGNOSIS — R059 Cough, unspecified: Secondary | ICD-10-CM

## 2018-02-26 DIAGNOSIS — J0100 Acute maxillary sinusitis, unspecified: Secondary | ICD-10-CM | POA: Diagnosis not present

## 2018-02-26 DIAGNOSIS — R05 Cough: Secondary | ICD-10-CM

## 2018-02-26 DIAGNOSIS — F17200 Nicotine dependence, unspecified, uncomplicated: Secondary | ICD-10-CM | POA: Diagnosis not present

## 2018-02-26 MED ORDER — AMOXICILLIN 500 MG PO TABS: ORAL_TABLET | ORAL | 0 refills | Status: AC

## 2018-02-26 MED ORDER — HYDROCODONE-HOMATROPINE 5-1.5 MG/5ML PO SYRP: 5.0000 mL | ORAL_SOLUTION | Freq: Three times a day (TID) | ORAL | 0 refills | Status: DC | PRN

## 2018-02-26 NOTE — Progress Notes (Signed)
Subjective:  Kelsey White is a 62 y.o. female who presents for  Chief Complaint  Patient presents with  . sinus    headache, congestion, cough, X 10 days   Symptoms include headache, cough, congestion, getting out some phlegm from sinuses, and symptoms have persisted for 10 days.  Does have some body aches.  No chills  No fever, no NVD.  Using Mucinex for symptoms.  denies sick contacts.    Past history is significant for smoker.   No hx/o asthma or COPD.  No prior inhaler use.  No other aggravating or relieving factors.  No other complaint.    Past Medical History:  Diagnosis Date  . Anemia   . Anxiety   . Diabetes mellitus without complication ([place]) 8366  . History of stress test 10/2012   stress test and echo  . Hyperlipidemia 2008  . Hypertension 1985  . Lumbar foraminal stenosis    herniated disc, spondylosis; Dr. Marlaine Hind 08/2017  . PVC (premature ventricular contraction) 10/2012   cardiology eval for Asymptomatic PVCs controlled on beta-blocker.  Dr. Trish Mage    Current Outpatient Medications on File Prior to Visit  Medication Sig Dispense Refill  . ARIPiprazole (ABILIFY) 5 MG tablet Take 1 tablet (5 mg total) by mouth daily. 90 tablet 1  . aspirin EC 81 MG tablet Take 81 mg by mouth daily.    Marland Kitchen atenolol (TENORMIN) 100 MG tablet Take 1 tablet (100 mg total) by mouth daily. 90 tablet 1  . atorvastatin (LIPITOR) 40 MG tablet Take 1 tablet (40 mg total) by mouth daily. 90 tablet 3  . Cholecalciferol (VITAMIN D PO) Take by mouth.    . escitalopram (LEXAPRO) 20 MG tablet Take 1 tablet (20 mg total) by mouth daily. 90 tablet 1  . ferrous gluconate (FERGON) 324 MG tablet TAKE 1 TABLET BY MOUTH TWICE DAILY WITH A MEAL 60 tablet 2  . fluticasone (FLONASE) 50 MCG/ACT nasal spray PLACE 1 SPRAY INTO EACH NOSTRIL EVERY DAY 16 g 2  . hydrochlorothiazide (HYDRODIURIL) 25 MG tablet Take 1 tablet (25 mg total) by mouth daily. 90 tablet 1  . HYDROcodone-acetaminophen  (NORCO/VICODIN) 5-325 MG tablet TAKE 1 TO 2 TABLETS EVERY 12 HOURS AS NEEDED FOR SEVERE PAIN 60 tablet 0  . losartan (COZAAR) 100 MG tablet Take 1 tablet (100 mg total) by mouth daily. 90 tablet 1  . MAGNESIUM PO Take by mouth.    . metFORMIN (GLUCOPHAGE) 1000 MG tablet Take 1 tablet (1,000 mg total) by mouth 2 (two) times daily with a meal. 180 tablet 3  . tiZANidine (ZANAFLEX) 4 MG tablet TAKE 1/2 TABLET BY MOUTH TWICE DAILY AS NEEDED FOR SPASMS 30 tablet 2  . TRULICITY 2.94 TM/5.4YT SOPN 0.75 MG ONCE A WEEK SUBCUTANEOUSLY 90 DAYS 1.5 mL 5  . zolpidem (AMBIEN) 10 MG tablet Take 1 tablet (10 mg total) by mouth at bedtime as needed for sleep. 30 tablet 2  . butalbital-acetaminophen-caffeine (FIORICET, ESGIC) 50-325-40 MG tablet Take 1-2 tablets by mouth every 6 (six) hours as needed for headache. (Patient not taking: Reported on 02/26/2018) 10 tablet 0  . ondansetron (ZOFRAN) 4 MG tablet Take 1 tablet (4 mg total) by mouth every 8 (eight) hours as needed for nausea or vomiting. (Patient not taking: Reported on 02/26/2018) 20 tablet 0   Current Facility-Administered Medications on File Prior to Visit  Medication Dose Route Frequency Provider Last Rate Last Dose  . 0.9 %  sodium chloride infusion  500 mL  Intravenous Once Irene Shipper, MD        ROS as in subjective   Objective: BP 136/80   Pulse 80   Temp 98 F (36.7 C) (Oral)   Resp 16   Ht 5\' 2"  (1.575 m)   Wt 143 lb 12.8 oz (65.2 kg)   SpO2 97%   BMI 26.30 kg/m   General appearance: Alert, well developed, well nourished, no distress                             Skin: warm, no rash                           Head: +frontal and maxillary sinus tenderness,                            Eyes: conjunctiva pink, corneas clear                            Ears: flat left tympanic membrane, flat right tympanic membrane, external ear canals normal                          Nose: septum midline, turbinates swollen, with erythema and clear  discharge             Mouth/throat: MMM, tongue normal, mild pharyngeal erythema                           Neck: supple, no adenopathy, no thyromegaly, non tender                         Lungs: clear, no wheezes, no rales, no rhonchi        Assessment  Encounter Diagnoses  Name Primary?  . Acute non-recurrent maxillary sinusitis Yes  . Smoker   . Cough       Plan: Discussed findings, recommendations below.   Patient Instructions  Recommendations  Continue to drink plenty of water   Use nasal saline to flush the sinuses  Begin Amoxicillin antibiotic and complete the course of antibiotic  Use Mucinex OTC  For worse cough, you can use the Hycodan cough syrup sent to pharmacy  Consider taking probiotic such as Align or Acidophilus or Kambuchi      Using Saline Nose Drops with Bulb Syringe A bulb syringe is used to clear your nose. You may use it when you have a stuffy nose, nasal congestion, sinus pressure, or sneezing.   SALINE SOLUTION You can buy nose drops at your local drug store. You can also make nose drops yourself. Mix 1 cup of water with  teaspoon of salt. Stir. Store this mixture at room temperature. Make a new batch daily.  USE THE BULB IN COMBINATION WITH SALINE NOSE DROPS  Squeeze the air out of the bulb before suctioning the saline mixture.  While still squeezing the bulb flat, place the tip of the bulb into the saline mixture.  Let air come back into the bulb.  This will suction up the saline mixture.  Gently flush one nostril at a time.  Salt water nose drops will then moisten your  congested nose and loosen secretions before suctioning.  Use the bulb syringe as directed below  to suction.  USING THE BULB SYRINGE TO SUCTION  While still squeezing the bulb flat, place the tip of the bulb into a nostril. Let air come back into the bulb. The suction will pull snot out of the nose and into the bulb.  Repeat on the other nostril.  Squeeze syringe  several times into a tissue.  CLEANING THE BULB SYRINGE  Clean the bulb syringe every day with hot soapy water.  Clean the inside of the bulb by squeezing the bulb while the tip is in soapy water.  Rinse by squeezing the bulb while the tip is in clean hot water.  Store the bulb with the tip side down on paper towel.  HOME CARE INSTRUCTIONS   Use saline nose drops often to keep the nose open and not stuffy.  Throw away used salt water. Make a new solution every time.  Do not use the same solution and dropper for another person  If you do not prefer to use nasal saline flush, other options include nasal saline spray or the [employer], both of which are available over the counter at your pharmacy.        Patient was advised to call or return if worse or not improving in the next few days.    Patient voiced understanding of diagnosis, recommendations, and treatment plan.  Lucelia was seen today for sinus.  Diagnoses and all orders for this visit:  Acute non-recurrent maxillary sinusitis  Smoker  Cough  Other orders -     HYDROcodone-homatropine (HYCODAN) 5-1.5 MG/5ML syrup; Take 5 mLs by mouth every 8 (eight) hours as needed for up to 5 days for cough. -     amoxicillin (AMOXIL) 500 MG tablet; 2 tablets po BID x 10 days

## 2018-02-26 NOTE — Patient Instructions (Signed)
Recommendations  Continue to drink plenty of water   Use nasal saline to flush the sinuses  Begin Amoxicillin antibiotic and complete the course of antibiotic  Use Mucinex OTC  For worse cough, you can use the Hycodan cough syrup sent to pharmacy  Consider taking probiotic such as Align or Acidophilus or Kambuchi      Using Saline Nose Drops with Bulb Syringe A bulb syringe is used to clear your nose. You may use it when you have a stuffy nose, nasal congestion, sinus pressure, or sneezing.   SALINE SOLUTION You can buy nose drops at your local drug store. You can also make nose drops yourself. Mix 1 cup of water with  teaspoon of salt. Stir. Store this mixture at room temperature. Make a new batch daily.  USE THE BULB IN COMBINATION WITH SALINE NOSE DROPS  Squeeze the air out of the bulb before suctioning the saline mixture.  While still squeezing the bulb flat, place the tip of the bulb into the saline mixture.  Let air come back into the bulb.  This will suction up the saline mixture.  Gently flush one nostril at a time.  Salt water nose drops will then moisten your  congested nose and loosen secretions before suctioning.  Use the bulb syringe as directed below to suction.  USING THE BULB SYRINGE TO SUCTION  While still squeezing the bulb flat, place the tip of the bulb into a nostril. Let air come back into the bulb. The suction will pull snot out of the nose and into the bulb.  Repeat on the other nostril.  Squeeze syringe several times into a tissue.  CLEANING THE BULB SYRINGE  Clean the bulb syringe every day with hot soapy water.  Clean the inside of the bulb by squeezing the bulb while the tip is in soapy water.  Rinse by squeezing the bulb while the tip is in clean hot water.  Store the bulb with the tip side down on paper towel.  HOME CARE INSTRUCTIONS   Use saline nose drops often to keep the nose open and not stuffy.  Throw away used salt water.  Make a new solution every time.  Do not use the same solution and dropper for another person  If you do not prefer to use nasal saline flush, other options include nasal saline spray or the [employer], both of which are available over the counter at your pharmacy.

## 2018-03-06 ENCOUNTER — Telehealth: Payer: Self-pay | Admitting: Medical

## 2018-03-06 NOTE — Telephone Encounter (Signed)
Received requested records from [facility]. Sending back for review.  °

## 2018-03-17 ENCOUNTER — Other Ambulatory Visit: Payer: Self-pay | Admitting: Medical

## 2018-03-20 ENCOUNTER — Other Ambulatory Visit: Payer: Self-pay | Admitting: Medical

## 2018-03-20 ENCOUNTER — Telehealth: Payer: Self-pay | Admitting: Medical

## 2018-03-20 MED ORDER — ZOLPIDEM TARTRATE 10 MG PO TABS: 10.0000 mg | ORAL_TABLET | Freq: Every evening | ORAL | 2 refills | Status: DC | PRN

## 2018-03-20 NOTE — Telephone Encounter (Signed)
Pt states pharmacy sent refill request a couple days ago for her Ambien and they haven't heard back.  She is completely out and needs refill.  She has appt scheduled 04/03/18.

## 2018-03-22 ENCOUNTER — Other Ambulatory Visit: Payer: Self-pay | Admitting: Medical

## 2018-03-22 ENCOUNTER — Encounter: Payer: Self-pay | Admitting: Internal Medicine

## 2018-03-22 MED ORDER — HYDROCODONE-HOMATROPINE 5-1.5 MG/5ML PO SYRP: 5.0000 mL | ORAL_SOLUTION | Freq: Three times a day (TID) | ORAL | 0 refills | Status: AC | PRN

## 2018-04-03 ENCOUNTER — Encounter: Payer: Self-pay | Admitting: Medical

## 2018-04-03 ENCOUNTER — Ambulatory Visit: Payer: BLUE CROSS/BLUE SHIELD | Admitting: Medical

## 2018-04-03 VITALS — BP 112/72 | HR 71 | Temp 97.7°F | Wt 145.4 lb

## 2018-04-03 DIAGNOSIS — E785 Hyperlipidemia, unspecified: Secondary | ICD-10-CM

## 2018-04-03 DIAGNOSIS — E118 Type 2 diabetes mellitus with unspecified complications: Secondary | ICD-10-CM | POA: Diagnosis not present

## 2018-04-03 DIAGNOSIS — F17200 Nicotine dependence, unspecified, uncomplicated: Secondary | ICD-10-CM

## 2018-04-03 DIAGNOSIS — I10 Essential (primary) hypertension: Secondary | ICD-10-CM

## 2018-04-03 LAB — POCT GLYCOSYLATED HEMOGLOBIN (HGB A1C): HEMOGLOBIN A1C: 5.9 % — AB (ref 4.0–5.6)

## 2018-04-03 MED ORDER — FERROUS GLUCONATE 324 (38 FE) MG PO TABS: ORAL_TABLET | ORAL | 2 refills | Status: AC

## 2018-04-03 NOTE — Progress Notes (Signed)
Subjective: Chief Complaint  Patient presents with  . other    diabetes check   Here for diabetes f/u.   Diabetes: Current treatments: Trulicity 1.61 weekly, Metformin 1000mg  BID Medication compliance: excellent  Patient is checking home blood sugars.   Home blood sugar records: hasn't been checking recently.   Been working a lot at [employer].  Current symptoms include: none. Patient denies foot ulcerations, hyperglycemia, hypoglycemia , nausea, polydipsia and polyuria.  Patient is checking their feet daily. Foot concerns (callous, ulcer, wound, thickened nails, toenail fungus, skin fungus, hammer toe): none Diet and exercise - some exercies, walk the dogs.    HTN - compliant with Atenolol 100mg  daily, Losartan 100mg  and HCTZ 25mg  daily  Hyperlipidemia - compliant with Lipitor 40mg  daily, aspirin 81mg  daily  Iron deficiency - compliant with Fergon 324mg  once daily.    Past Medical History:  Diagnosis Date  . Anemia   . Anxiety   . Diabetes mellitus without complication ([place]) 0960  . History of stress test 10/2012   stress test and echo  . Hyperlipidemia 2008  . Hypertension 1985  . Lumbar foraminal stenosis    herniated disc, spondylosis; Dr. Marlaine Hind 08/2017  . PVC (premature ventricular contraction) 10/2012   cardiology eval for Asymptomatic PVCs controlled on beta-blocker.  Dr. Trish Mage   Current Outpatient Medications on File Prior to Visit  Medication Sig Dispense Refill  . ARIPiprazole (ABILIFY) 5 MG tablet Take 1 tablet (5 mg total) by mouth daily. 90 tablet 1  . aspirin EC 81 MG tablet Take 81 mg by mouth daily.    Marland Kitchen atenolol (TENORMIN) 100 MG tablet Take 1 tablet (100 mg total) by mouth daily. 90 tablet 1  . atorvastatin (LIPITOR) 40 MG tablet Take 1 tablet (40 mg total) by mouth daily. 90 tablet 3  . Cholecalciferol (VITAMIN D PO) Take by mouth.    . escitalopram (LEXAPRO) 20 MG tablet Take 1 tablet (20 mg total) by mouth daily. 90 tablet 1  .  fluticasone (FLONASE) 50 MCG/ACT nasal spray PLACE 1 SPRAY INTO EACH NOSTRIL EVERY DAY 16 g 2  . hydrochlorothiazide (HYDRODIURIL) 25 MG tablet Take 1 tablet (25 mg total) by mouth daily. 90 tablet 1  . HYDROcodone-acetaminophen (NORCO/VICODIN) 5-325 MG tablet TAKE 1 TO 2 TABLETS EVERY 12 HOURS AS NEEDED FOR SEVERE PAIN 60 tablet 0  . losartan (COZAAR) 100 MG tablet Take 1 tablet (100 mg total) by mouth daily. 90 tablet 1  . MAGNESIUM PO Take by mouth.    . metFORMIN (GLUCOPHAGE) 1000 MG tablet Take 1 tablet (1,000 mg total) by mouth 2 (two) times daily with a meal. 180 tablet 3  . tiZANidine (ZANAFLEX) 4 MG tablet TAKE 1/2 TABLET BY MOUTH TWICE DAILY AS NEEDED FOR SPASMS 30 tablet 2  . TRULICITY 4.54 UJ/8.1XB SOPN 0.75 MG ONCE A WEEK SUBCUTANEOUSLY 90 DAYS 1.5 mL 5  . zolpidem (AMBIEN) 10 MG tablet Take 1 tablet (10 mg total) by mouth at bedtime as needed for sleep. 30 tablet 2  . amoxicillin (AMOXIL) 500 MG tablet 2 tablets po BID x 10 days (Patient not taking: Reported on 04/03/2018) 40 tablet 0  . butalbital-acetaminophen-caffeine (FIORICET, ESGIC) 50-325-40 MG tablet Take 1-2 tablets by mouth every 6 (six) hours as needed for headache. (Patient not taking: Reported on 02/26/2018) 10 tablet 0  . ondansetron (ZOFRAN) 4 MG tablet Take 1 tablet (4 mg total) by mouth every 8 (eight) hours as needed for nausea or vomiting. (Patient not  taking: Reported on 02/26/2018) 20 tablet 0   Current Facility-Administered Medications on File Prior to Visit  Medication Dose Route Frequency Provider Last Rate Last Dose  . 0.9 %  sodium chloride infusion  500 mL Intravenous Once Irene Shipper, MD       ROS as in subjective    Objective BP 112/72 (BP Location: Left Arm, Patient Position: Sitting)   Pulse 71   Temp 97.7 F (36.5 C)   Wt 145 lb 6.4 oz (66 kg)   SpO2 96%   BMI 26.59 kg/m   Wt Readings from Last 3 Encounters:  04/03/18 145 lb 6.4 oz (66 kg)  02/26/18 143 lb 12.8 oz (65.2 kg)  02/07/18  146 lb 12.8 oz (66.6 kg)   BP Readings from Last 3 Encounters:  04/03/18 112/72  02/26/18 136/80  02/07/18 132/80    General appearance: alert, no distress, WD/WN,  Neck: supple, no lymphadenopathy, no thyromegaly, no masses Heart: +possible 4-6/5 faint systolic murmur heard best in upper sternal borders, othewise RRR, normal S1, S2 Lungs: CTA bilaterally, no wheezes, rhonchi, or rales Pulses: 2+ symmetric, upper and lower extremities, normal cap refill Ext: no edema   Assessment: Encounter Diagnoses  Name Primary?  . Essential hypertension, benign Yes  . Diabetes mellitus with complication ([place])   . Hyperlipidemia, unspecified hyperlipidemia type   . Smoker   . Hypercalcemia      Plan: History of anemia, low iron- colonoscopy done June 2019, and Dr. Jenny Reichmann.  Gastroenterology wanted her to stay on iron once daily indefinitely.  Diabetes- Discussed strict diet, regular exercise, continue current medications.  Labs today.  Continue daily foot checks, routine glucose monitoring.  Hemoglobin A1c lab today.  Hypertension-continue same 3 medications.  BP at goal  Hyperlipidemia-continue same medication, lipids reviewed, plan to recheck next visit spring 2020.  Smoking-advise she stop smoking, discussed risk of tobacco use  I reviewed her mammogram from October 2019 that was normal.  Reviewed [facility] notes from 02/21/18 visit with Dr. Vania Rea, pap was updated showing ASCUS, and she has vulvar sebaceous cysts.  Was advised to repeat pap 1 year.   Hypercalcemia - recheck labs in 65mo  Next visit plan: CMET, CBC diff, HgbA1C, lipid, micral, UA clean catch, PFT, plan again for CT chest   Follow-up in 3 months   Sherra was seen today for other.  Diagnoses and all orders for this visit:  Essential hypertension, benign  Diabetes mellitus with complication ([place]) -     POCT glycosylated hemoglobin (Hb A1C)  Hyperlipidemia, unspecified hyperlipidemia  type  Smoker  Hypercalcemia  Other orders -     ferrous gluconate (FERGON) 324 MG tablet; Once daily

## 2018-04-09 ENCOUNTER — Other Ambulatory Visit: Payer: Self-pay | Admitting: Medical

## 2018-04-09 NOTE — Telephone Encounter (Signed)
Is this ok to refill?  

## 2018-04-21 ENCOUNTER — Telehealth: Payer: Self-pay | Admitting: Medical

## 2018-04-21 NOTE — Telephone Encounter (Signed)
Fax recv'd for P.A. Trulicity, can't get it to process thru cover my meds will call plan Monday

## 2018-05-11 NOTE — Telephone Encounter (Signed)
PA approved, pt informed.

## 2018-05-29 ENCOUNTER — Telehealth: Payer: Self-pay

## 2018-05-29 NOTE — Telephone Encounter (Signed)
Pt called and stated she has clear mucus and a cold she caught from her sister. She is currently out of town and wants to know if something can be called in for her cough.

## 2018-05-31 NOTE — Telephone Encounter (Signed)
If not allergic to this, can all out Tessalon 200mg  1 up to every 8 hours prn cough, #20, no refill

## 2018-06-01 NOTE — Telephone Encounter (Signed)
lmom asking patient to call back about cough medicine

## 2018-06-04 ENCOUNTER — Other Ambulatory Visit: Payer: Self-pay

## 2018-06-04 MED ORDER — ZOLPIDEM TARTRATE 10 MG PO TABS: 10.0000 mg | ORAL_TABLET | Freq: Every evening | ORAL | 1 refills | Status: DC | PRN

## 2018-06-04 NOTE — Telephone Encounter (Signed)
Is this okay to refill? 

## 2018-06-11 ENCOUNTER — Other Ambulatory Visit: Payer: Self-pay | Admitting: Medical

## 2018-06-11 ENCOUNTER — Telehealth: Payer: Self-pay | Admitting: Medical

## 2018-06-11 MED ORDER — ZOLPIDEM TARTRATE 10 MG PO TABS: 10.0000 mg | ORAL_TABLET | Freq: Every evening | ORAL | 0 refills | Status: DC | PRN

## 2018-06-11 NOTE — Telephone Encounter (Signed)
Call in #30 and no refill

## 2018-06-11 NOTE — Telephone Encounter (Signed)
Pt left message that she is visiting her daughter and will be there 3 weeks in [facility], [facility]  She needs [phone] [phone].

## 2018-06-11 NOTE — Telephone Encounter (Signed)
Please advise 

## 2018-06-12 ENCOUNTER — Other Ambulatory Visit: Payer: Self-pay | Admitting: Medical

## 2018-06-12 ENCOUNTER — Telehealth: Payer: Self-pay | Admitting: Medical

## 2018-06-12 MED ORDER — ZOLPIDEM TARTRATE 10 MG PO TABS: 10.0000 mg | ORAL_TABLET | Freq: Every evening | ORAL | 0 refills | Status: DC | PRN

## 2018-06-12 NOTE — Telephone Encounter (Signed)
I can not refill Ambien.  I put the pharmacy in the chart.   Just make sure when you send it in that [facility] is [facility]

## 2018-06-12 NOTE — Telephone Encounter (Signed)
FYI, you CAN call out Ambien, but not narcotics or ADD medications.    I sent it electronically though.

## 2018-06-12 NOTE — Telephone Encounter (Signed)
Pt left message and stated that she will be out of town visiting her daughter for 3 weeks and needs [facility]. [place] address is [place] [address] and their number is [phone]

## 2018-06-12 NOTE — Telephone Encounter (Signed)
Ok thanks 

## 2018-06-25 ENCOUNTER — Other Ambulatory Visit: Payer: Self-pay | Admitting: Medical

## 2018-07-01 ENCOUNTER — Other Ambulatory Visit: Payer: Self-pay | Admitting: Medical

## 2018-07-02 ENCOUNTER — Ambulatory Visit: Payer: BLUE CROSS/BLUE SHIELD | Admitting: Medical

## 2018-07-03 ENCOUNTER — Other Ambulatory Visit: Payer: Self-pay

## 2018-07-03 MED ORDER — ZOLPIDEM TARTRATE 10 MG PO TABS: 10.0000 mg | ORAL_TABLET | Freq: Every evening | ORAL | 0 refills | Status: DC | PRN

## 2018-07-03 NOTE — Telephone Encounter (Signed)
Is this ok to refill?  

## 2018-07-25 ENCOUNTER — Telehealth: Payer: Self-pay

## 2018-07-25 NOTE — Telephone Encounter (Signed)
error 

## 2018-08-09 ENCOUNTER — Other Ambulatory Visit: Payer: Self-pay

## 2018-08-09 MED ORDER — ZOLPIDEM TARTRATE 10 MG PO TABS: 10.0000 mg | ORAL_TABLET | Freq: Every evening | ORAL | 0 refills | Status: DC | PRN

## 2018-08-09 NOTE — Telephone Encounter (Signed)
Is this ok to refill?  

## 2018-09-06 ENCOUNTER — Other Ambulatory Visit: Payer: Self-pay

## 2018-09-06 MED ORDER — ZOLPIDEM TARTRATE 10 MG PO TABS: 10.0000 mg | ORAL_TABLET | Freq: Every evening | ORAL | 0 refills | Status: DC | PRN

## 2018-09-06 NOTE — Telephone Encounter (Signed)
[facility]. Please advise K[facility] 

## 2018-10-03 ENCOUNTER — Other Ambulatory Visit: Payer: Self-pay | Admitting: Medical

## 2018-10-03 ENCOUNTER — Other Ambulatory Visit: Payer: Self-pay

## 2018-10-03 MED ORDER — ZOLPIDEM TARTRATE 10 MG PO TABS: 10.0000 mg | ORAL_TABLET | Freq: Every evening | ORAL | 0 refills | Status: AC | PRN

## 2018-10-03 NOTE — Telephone Encounter (Signed)
Is this ok to refill?  

## 2018-11-01 ENCOUNTER — Other Ambulatory Visit: Payer: Self-pay

## 2018-11-01 NOTE — Telephone Encounter (Signed)
[facility]. It was just filled 4 weeks ago and no PCP on file . Please advise. [place]

## 2018-11-01 NOTE — Telephone Encounter (Signed)
Left message on voicemail for patient to call back. 

## 2018-11-01 NOTE — Telephone Encounter (Signed)
Lets get in for med check

## 2020-07-03 IMAGING — CR DG SMALL BOWEL
15 series · 15 of 15 positions shown · non-contrast
Comparison: None.

CLINICAL DATA: 61-year-old female with anemia. Evaluate for small
bowel abnormality.

EXAM:
SMALL BOWEL SERIES
TECHNIQUE: Following ingestion of thin barium, serial small bowel images were
obtained including spot views of the terminal ileum.
FLUOROSCOPY TIME:  Fluoroscopy Time:  1 minutes 3 seconds
Radiation Exposure Index (if provided by the fluoroscopic device):
18.2 mGy
Number of Acquired Spot Images: 16

[t abdomen supine (1 of 4)]
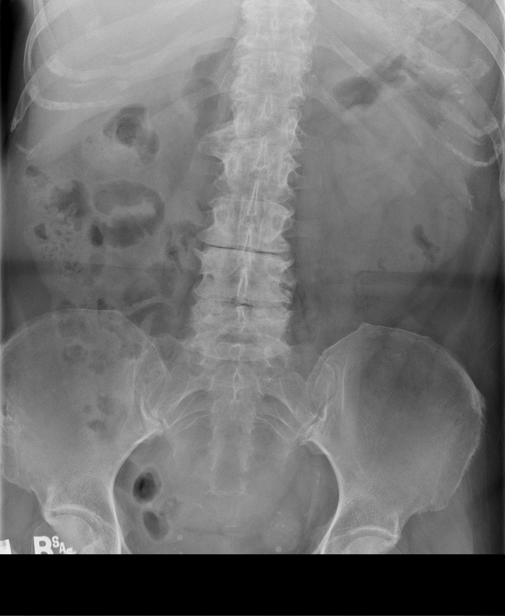

[t abdomen supine (2 of 4)]
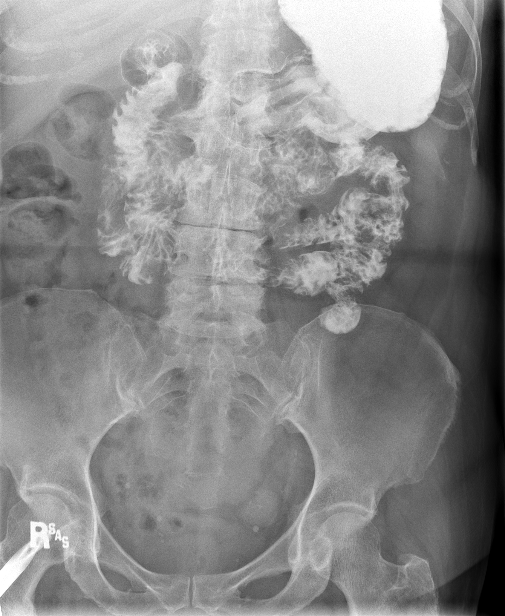

[t abdomen supine (3 of 4)]
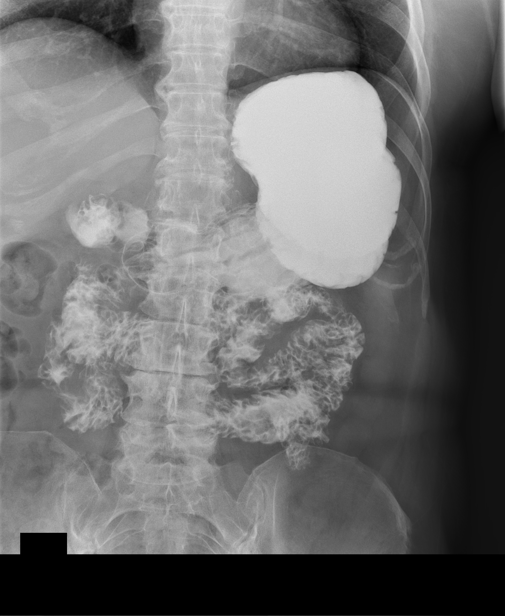

[t abdomen supine (4 of 4)]
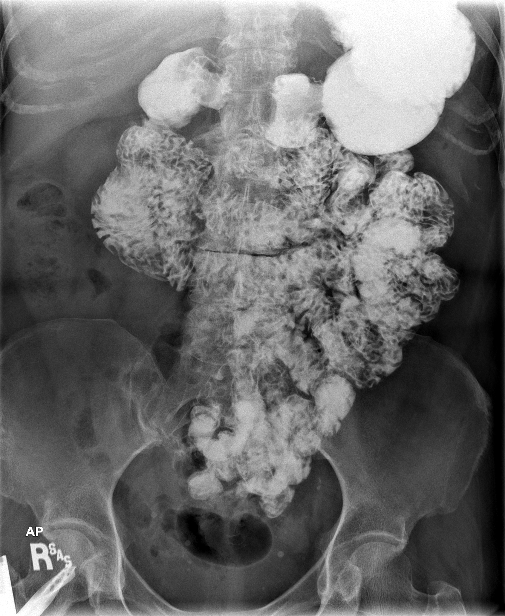

[fluoro_barium 2fps_bw]
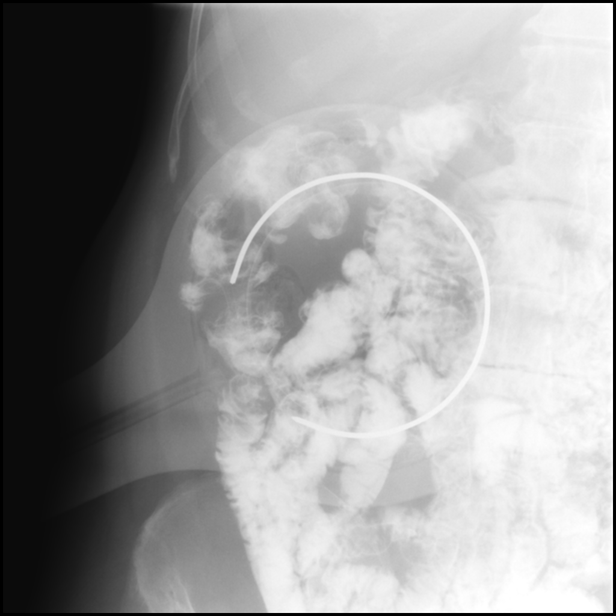

[cp_standard (1 of 10)]
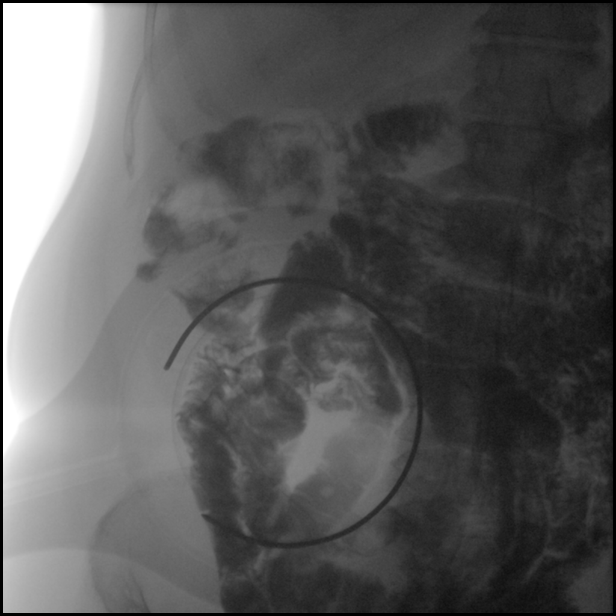

[cp_standard (2 of 10)]
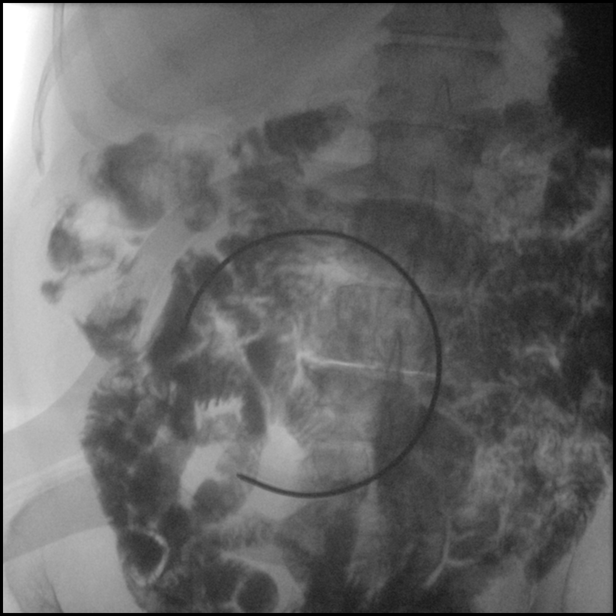

[cp_standard (3 of 10)]
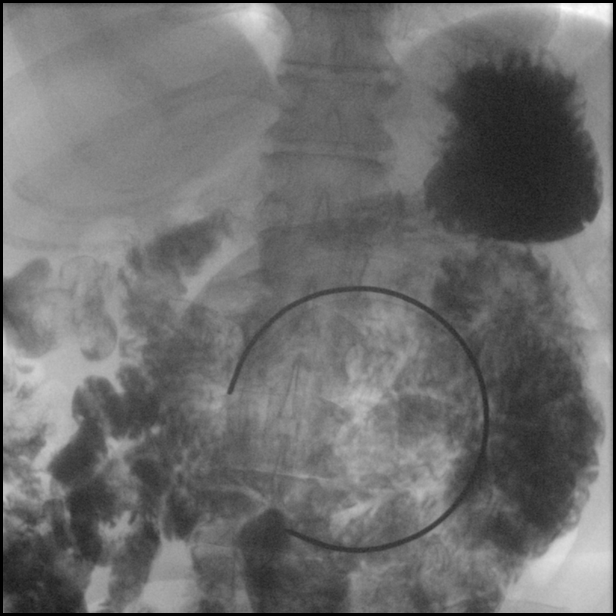

[cp_standard (4 of 10)]
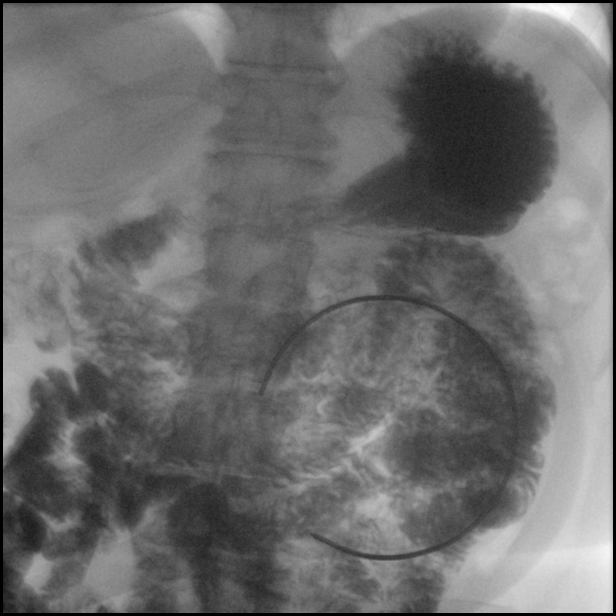

[cp_standard (5 of 10)]
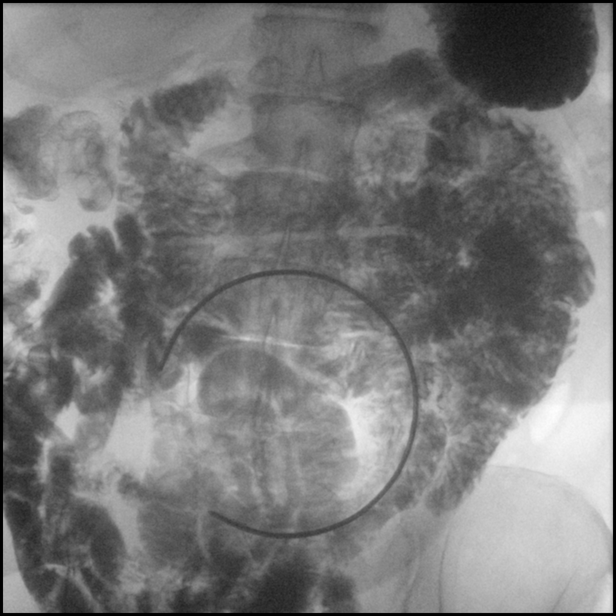

[cp_standard (6 of 10)]
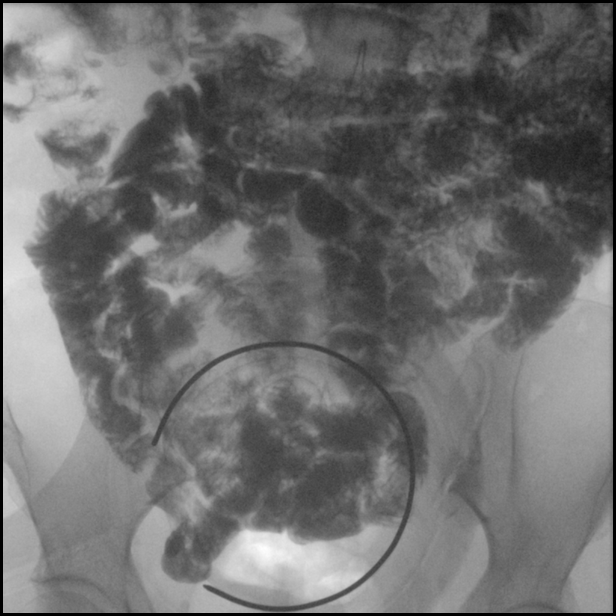

[cp_standard (7 of 10)]
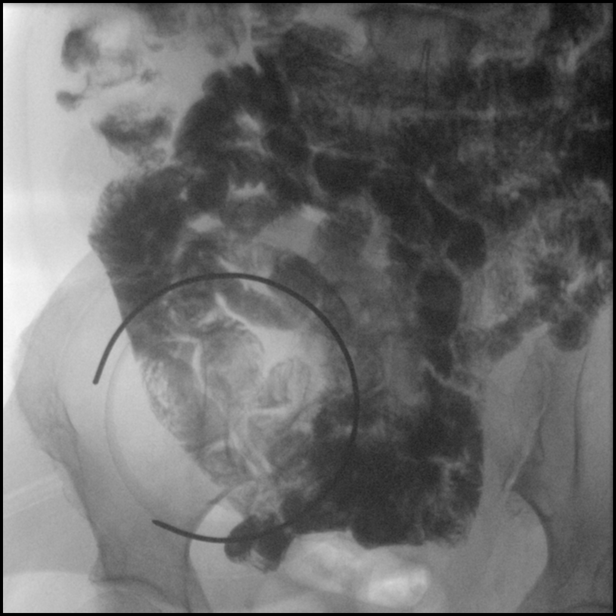

[cp_standard (8 of 10)]
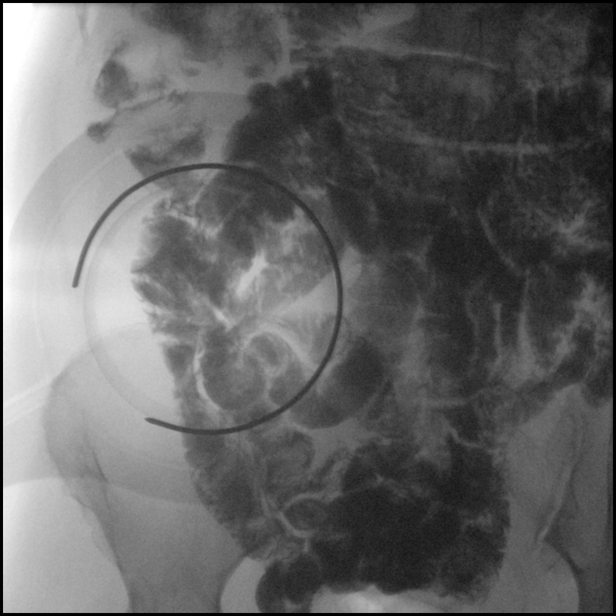

[cp_standard (9 of 10)]
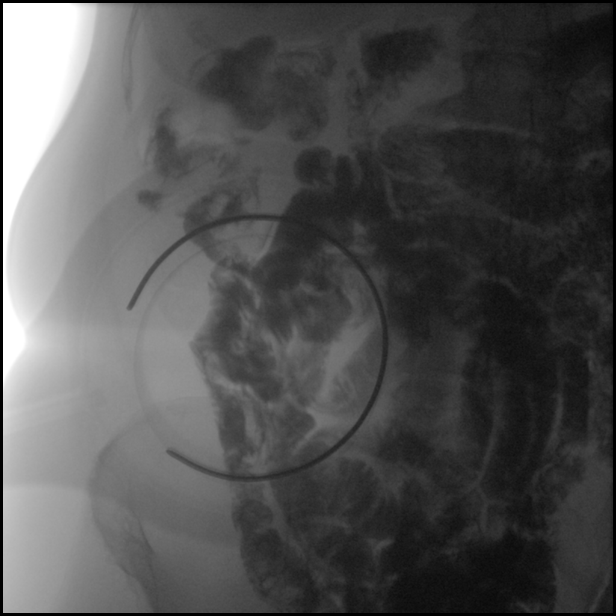

[cp_standard (10 of 10)]
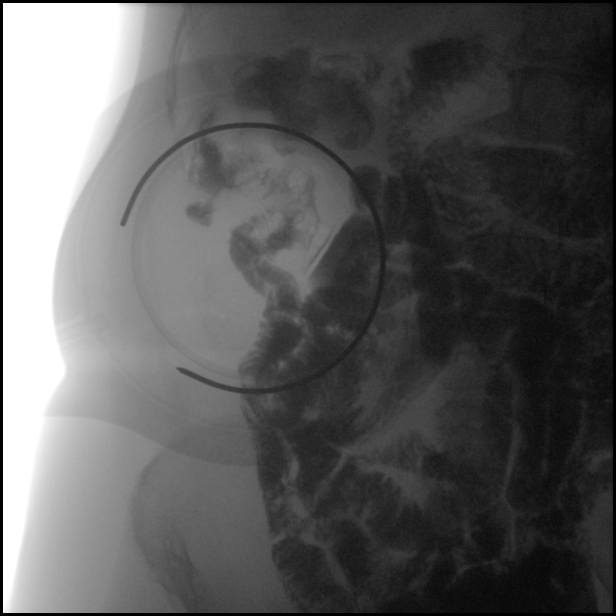

[15 of 15 positions shown; findings below may reference images not displayed]

FINDINGS: Scout film of the abdomen demonstrates unremarkable bowel gas
pattern.

Small bowel is unremarkable.

No fixed filling defects or areas of fixed narrowing noted.

Small bowel mucosal pattern is normal.

The terminal ileum is unremarkable.

Transit time to the colon is 1 hour 30 minutes.
IMPRESSION: Normal small bowel series.

## 2022-08-02 ENCOUNTER — Encounter: Payer: Self-pay | Admitting: Internal Medicine
